# Patient Record
Sex: Male | Born: 1950
Health system: Southern US, Community
[De-identification: ages and names within clinical notes are randomized; demographics above are authoritative.]

## PROBLEM LIST (undated history)

## (undated) DIAGNOSIS — K635 Polyp of colon: Secondary | ICD-10-CM

## (undated) DIAGNOSIS — I10 Essential (primary) hypertension: Secondary | ICD-10-CM

## (undated) DIAGNOSIS — R011 Cardiac murmur, unspecified: Secondary | ICD-10-CM

## (undated) DIAGNOSIS — E669 Obesity, unspecified: Secondary | ICD-10-CM

## (undated) DIAGNOSIS — E785 Hyperlipidemia, unspecified: Secondary | ICD-10-CM

## (undated) HISTORY — DX: Hyperlipidemia, unspecified: E78.5

## (undated) HISTORY — PX: COLONOSCOPY: SHX174

## (undated) HISTORY — DX: Obesity, unspecified: E66.9

## (undated) HISTORY — DX: Cardiac murmur, unspecified: R01.1

## (undated) HISTORY — PX: WISDOM TOOTH EXTRACTION: SHX21

## (undated) HISTORY — DX: Polyp of colon: K63.5

## (undated) HISTORY — DX: Essential (primary) hypertension: I10

---

## 1993-06-30 HISTORY — PX: ORIF FINGER / THUMB FRACTURE: SUR932

## 2000-06-03 ENCOUNTER — Encounter: Payer: Self-pay | Admitting: Emergency Medicine

## 2000-06-03 ENCOUNTER — Emergency Department (HOSPITAL_COMMUNITY): Admission: EM | Admit: 2000-06-03 | Discharge: 2000-06-03 | Payer: Self-pay | Admitting: Emergency Medicine

## 2010-07-24 ENCOUNTER — Ambulatory Visit: Admit: 2010-07-24 | Payer: Self-pay | Admitting: Cardiology

## 2010-08-22 LAB — HM COLONOSCOPY

## 2010-08-23 ENCOUNTER — Encounter: Payer: Self-pay | Admitting: Cardiology

## 2010-08-23 ENCOUNTER — Institutional Professional Consult (permissible substitution) (INDEPENDENT_AMBULATORY_CARE_PROVIDER_SITE_OTHER): Payer: BC Managed Care – PPO | Admitting: Cardiology

## 2010-08-23 DIAGNOSIS — E782 Mixed hyperlipidemia: Secondary | ICD-10-CM | POA: Insufficient documentation

## 2010-08-23 DIAGNOSIS — E7800 Pure hypercholesterolemia, unspecified: Secondary | ICD-10-CM

## 2010-08-23 DIAGNOSIS — R011 Cardiac murmur, unspecified: Secondary | ICD-10-CM | POA: Insufficient documentation

## 2010-08-23 DIAGNOSIS — I10 Essential (primary) hypertension: Secondary | ICD-10-CM

## 2010-08-23 DIAGNOSIS — E785 Hyperlipidemia, unspecified: Secondary | ICD-10-CM | POA: Insufficient documentation

## 2010-08-27 NOTE — Assessment & Plan Note (Signed)
Summary: np6/heart murmur/bcbs/per donna 299000/notes on files/rs per ...   Referring Provider:  Dr. Patrina Levering Primary Provider:  Dr. Perrin Maltese  CC:  new patient.  Heart murmur.  Pt was prescribed a low dose bp medication by Dr. Perrin Maltese but has not started taking.  Pt waiting until cardiac consult.  He has had some chest pain in the past and a negative stress test. .  History of Present Illness: 60 yo with history of HTN and hyperlipidemia presents for cardiology evaluation.  Patient was noted on recent exam by his PCP to have a heart murmur.  Patient has good exercise tolerance.  He goes to the [facility] and works up for about 45 minutes at a time.  He can climb steps with no trouble.  No exertional dyspnea or chest pain.  He has rare episodes of very atypical chest pain.  He had a stress test in 2001 that he says he was told was normal.  BP has been running high, SBP 160s with Dr. Perrin Maltese and BP 140/76 today.  LDL was elevated when checked recently.    ECG: NSR, poor anterior R wave progression  Labs (2/12): HCT 39, K 4.2, creatinine 0.81, LDL 146, HDL 52, TSH normal  Current Medications (verified): 1)  None  Allergies (verified): No Known Drug Allergies  Past History:  Past Medical History: 1. Hyperlipidemia 2. Hypertension 3. Obesity 4. Colon polyp 5. Atypical chest pain: Stress test in 2001 was normal per patient's report.  6. Murmur  Family History: Father with CHF in 30s.  Siblings with no known coronary disease.  Mother died with cancer.   Social History: Quit smoking 1995.  Lives in [place], married, works in Air[employer].    Review of Systems       All systems reviewed and negative except as per HPI.   Vital Signs:  Patient profile:   60 year old male Height:      74 inches Weight:      281 pounds BMI:     36.21 Pulse rate:   67 / minute Pulse rhythm:   regular BP sitting:   140 / 76  (left arm) Cuff size:   large  Vitals Entered By: Judithe Modest CMA (August 23, 2010  9:12 AM)  Physical Exam  General:  Well developed, well nourished, in no acute distress. Overweight.  Head:  normocephalic and atraumatic Nose:  no deformity, discharge, inflammation, or lesions Mouth:  Teeth, gums and palate normal. Oral mucosa normal. Neck:  Neck supple, no JVD. No masses, thyromegaly or abnormal cervical nodes. Lungs:  Clear bilaterally to auscultation and percussion. Heart:  Non-displaced PMI, chest non-tender; regular rate and rhythm, S1, S2 without rubs or gallops. 2/6 early systolic murmur at RUSB.  Carotid upstroke normal, no bruit.  Pedals normal pulses. No edema, no varicosities. Abdomen:  Bowel sounds positive; abdomen soft and non-tender without masses, organomegaly, or hernias noted. No hepatosplenomegaly. Extremities:  No clubbing or cyanosis. Neurologic:  Alert and oriented x 3. Skin:  Intact without lesions or rashes. Psych:  Normal affect.   Impression & Recommendations:  Problem # 1:  CARDIAC MURMUR (ICD-785.2) Early systolic crescendo-decrescendo murmur at right upper sternal border.  This sounds like an aortic-type murmur, suspect aortic sclerosis or mild aortic stenosis.  Will get echocardiogram to assess.   Problem # 2:  HYPERTENSION, UNSPECIFIED (ICD-401.9) Elevated blood pressure.  Will start lisinopril 5 mg daily.  I will have him followup in 1 month and will get a BMET  at that time.   Problem # 3:  HYPERLIPIDEMIA-MIXED (ICD-272.4) LDL is elevated.  He will start simvastatin 20 mg daily with lipids/LFTs in 2 months.   Problem # 4:  CORONARY RISK Patient has a number of CAD risk factors but has good exercise tolerance with no exertional symptoms.  No definite indication for a stress test at this point.  Will have him start ASA 81 mg daily.   Other Orders: Echocardiogram (Echo)  Patient Instructions: 1)  Your physician recommends that you schedule a follow-up appointment in: f/u 1 month 2)  Your physician recommends that you return for lab  work in: BMET before next o.v. 3)  Your physician has recommended you make the following change in your medication: start 81mg  ASA IN 2 WEEKS, start simvastatin 20mg  every evening,start lisinopril 5mg  1 tab everyday 4)  Your physician has requested that you have an echocardiogram.  Echocardiography is a painless test that uses sound waves to create images of your heart. It provides your doctor with information about the size and shape of your heart and how well your heart's chambers and valves are working.  This procedure takes approximately one hour. There are no restrictions for this procedure. Prescriptions: PRINIVIL 5 MG TABS (LISINOPRIL) take 1 tab daily  #90 x 3   Entered by:   Ledon Snare, RN   Authorized by:   Marca Ancona, MD   Signed by:   Ledon Snare, RN on 08/23/2010   Method used:   Electronically to        [id number] (retail)       [address]       [place], [place]  [address]       Ph: [phone] or [phone]       Fax: [phone]   RxID:   [id number] SIMVASTATIN 20 MG TABS (SIMVASTATIN) take 1 tab every evening  #90 x 3   Entered by:   Ledon Snare, RN   Authorized by:   Marca Ancona, MD   Signed by:   Ledon Snare, RN on 08/23/2010   Method used:   Electronically to        [facility] (retail)       [address]       [place], [place]  [address]       Ph: [phone] or [phone]       Fax: [phone]   RxID:   [id number]

## 2010-09-02 ENCOUNTER — Other Ambulatory Visit (HOSPITAL_COMMUNITY): Payer: BC Managed Care – PPO

## 2010-09-02 ENCOUNTER — Other Ambulatory Visit: Payer: BC Managed Care – PPO

## 2010-09-09 ENCOUNTER — Encounter: Payer: Self-pay | Admitting: Cardiology

## 2010-09-24 ENCOUNTER — Ambulatory Visit: Payer: BC Managed Care – PPO | Admitting: Cardiology

## 2010-10-15 ENCOUNTER — Encounter: Payer: Self-pay | Admitting: Cardiology

## 2013-04-14 DIAGNOSIS — Z0271 Encounter for disability determination: Secondary | ICD-10-CM

## 2013-04-15 ENCOUNTER — Ambulatory Visit (INDEPENDENT_AMBULATORY_CARE_PROVIDER_SITE_OTHER): Payer: BC Managed Care – HMO | Admitting: Family Medicine

## 2013-04-15 VITALS — BP 120/80 | HR 62 | Temp 98.2°F | Resp 16 | Ht 74.5 in | Wt 247.0 lb

## 2013-04-15 DIAGNOSIS — E7800 Pure hypercholesterolemia, unspecified: Secondary | ICD-10-CM

## 2013-04-15 DIAGNOSIS — Z0000 Encounter for general adult medical examination without abnormal findings: Secondary | ICD-10-CM

## 2013-04-15 DIAGNOSIS — Z23 Encounter for immunization: Secondary | ICD-10-CM

## 2013-04-15 LAB — LIPID PANEL
Cholesterol: 191 mg/dL (ref 0–200)
HDL: 48 mg/dL (ref >39)
LDL Cholesterol: 121 mg/dL — ABNORMAL HIGH (ref 0–99)
Total CHOL/HDL Ratio: 4 Ratio
Triglycerides: 109 mg/dL (ref <150)
VLDL: 22 mg/dL (ref 0–40)

## 2013-04-15 LAB — COMPREHENSIVE METABOLIC PANEL
ALT: 14 U/L (ref 0–53)
AST: 16 U/L (ref 0–37)
Albumin: 4.5 g/dL (ref 3.5–5.2)
Alkaline Phosphatase: 26 U/L — ABNORMAL LOW (ref 39–117)
BUN: 14 mg/dL (ref 6–23)
CO2: 29 mEq/L (ref 19–32)
Calcium: 9.5 mg/dL (ref 8.4–10.5)
Chloride: 103 mEq/L (ref 96–112)
Creat: 0.78 mg/dL (ref 0.50–1.35)
Glucose, Bld: 108 mg/dL — ABNORMAL HIGH (ref 70–99)
Potassium: 4.7 mEq/L (ref 3.5–5.3)
Sodium: 139 mEq/L (ref 135–145)
Total Bilirubin: 0.6 mg/dL (ref 0.3–1.2)
Total Protein: 7.1 g/dL (ref 6.0–8.3)

## 2013-04-15 LAB — POCT CBC
Granulocyte percent: 62.2 %G (ref 37–80)
HCT, POC: 45.2 % (ref 43.5–53.7)
Hemoglobin: 14.5 g/dL (ref 14.1–18.1)
Lymph, poc: 2.2 (ref 0.6–3.4)
MCH, POC: 31 pg (ref 27–31.2)
MCHC: 32.1 g/dL (ref 31.8–35.4)
MCV: 96.6 fL (ref 80–97)
MID (cbc): 0.4 (ref 0–0.9)
MPV: 8.6 fL (ref 0–99.8)
POC Granulocyte: 4.3 (ref 2–6.9)
POC LYMPH PERCENT: 31.5 %L (ref 10–50)
POC MID %: 6.3 %M (ref 0–12)
Platelet Count, POC: 291 10*3/uL (ref 142–424)
RBC: 4.68 M/uL — AB (ref 4.69–6.13)
RDW, POC: 13.5 %
WBC: 6.9 10*3/uL (ref 4.6–10.2)

## 2013-04-15 LAB — PSA: PSA: 2.91 ng/mL (ref <=4.00)

## 2013-04-15 NOTE — Progress Notes (Addendum)
[facility] [address] [place] [address] [phone]  Date:  04/15/2013   Name:  Dale Mitchell   DOB:  February 16, 1951   MRN:  [id number]  PCP:  No primary provider on file.    Chief Complaint: Annual Exam and Labs Only   History of Present Illness:  Dale Mitchell is a 62 y.o. very pleasant male patient who presents with the following:  Here today for a CPE. He has seen cardiology in the past, but has since lost weight and his BP is improved. He does exercise regularly also.  He did have a cardiac evaluation and was told that all was ok.  It seems that he had a panic attack due to flying after 9/11 which triggered an episode of non- cardiac CP.  He is fasting today History of anemia  Last colonoscopy was in 2012.  All looked ok.   He would like to get a flu shot and zostavax rx Last Td in 2003 per paper chart.    Married, 2 adult children who live in the area  Patient Active Problem List   Diagnosis Date Noted  . HYPERLIPIDEMIA-MIXED 08/23/2010  . HYPERTENSION, UNSPECIFIED 08/23/2010  . CARDIAC MURMUR 08/23/2010    Past Medical History  Diagnosis Date  . HLD (hyperlipidemia)   . HTN (hypertension)   . Obesity   . Colon polyp   . Chest pain   . Murmur     History reviewed. No pertinent past surgical history.  History  Substance Use Topics  . Smoking status: Former Smoker    Quit date: 06/30/1993  . Smokeless tobacco: Not on file  . Alcohol Use: Not on file    Family History  Problem Relation Age of Onset  . Heart failure Father   . Emphysema Father   . Cancer Mother     No Known Allergies  Medication list has been reviewed and updated.  Current Outpatient Prescriptions on File Prior to Visit  Medication Sig Dispense Refill  . lisinopril (PRINIVIL,ZESTRIL) 5 MG tablet Take 5 mg by mouth daily.        . simvastatin (ZOCOR) 20 MG tablet Take 20 mg by mouth at bedtime.         No current facility-administered medications on file prior  to visit.    Review of Systems:  As per HPI- otherwise negative. He has lisinopril on his list but has not taken it in some time actually  He is also off the zocor for some time  Physical Examination: Filed Vitals:   04/15/13 0837  BP: 120/80  Pulse: 62  Temp: 98.2 F (36.8 C)  Resp: 16   Filed Vitals:   04/15/13 0837  Height: 6' 2.5" (1.892 m)  Weight: 247 lb (112.038 kg)   Body mass index is 31.3 kg/(m^2). Ideal Body Weight: Weight in (lb) to have BMI = 25: 196.9  GEN: WDWN, NAD, Non-toxic, A & O x 3   HEENT: Atraumatic, Normocephalic. Neck supple. No masses, No LAD.  Bilateral TM wnl, oropharynx normal.  PEERL,EOMI.   Ears and Nose: No external deformity. CV: RRR, No M/G/R. No JVD. No thrill. No extra heart sounds. PULM: CTA B, no wheezes, crackles, rhonchi. No retractions. No resp. distress. No accessory muscle use. ABD: S, NT, ND. No rebound. No HSM. EXTR: No c/c/e NEURO Normal gait.  PSYCH: Normally interactive. Conversant. Not depressed or anxious appearing.  Calm demeanor.  GN:FAOZHY exam, no testicular masses Rectal: normal,  no prostate enlargement or palpable nodules  Results for orders placed in visit on 04/15/13  POCT CBC      Result Value Range   WBC 6.9  4.6 - 10.2 K/uL   Lymph, poc 2.2  0.6 - 3.4   POC LYMPH PERCENT 31.5  10 - 50 %L   MID (cbc) 0.4  0 - 0.9   POC MID % 6.3  0 - 12 %M   POC Granulocyte 4.3  2 - 6.9   Granulocyte percent 62.2  37 - 80 %G   RBC 4.68 (*) 4.69 - 6.13 M/uL   Hemoglobin 14.5  14.1 - 18.1 g/dL   HCT, POC 16.1  09.6 - 53.7 %   MCV 96.6  80 - 97 fL   MCH, POC 31.0  27 - 31.2 pg   MCHC 32.1  31.8 - 35.4 g/dL   RDW, POC 04.5     Platelet Count, POC 291  142 - 424 K/uL   MPV 8.6  0 - 99.8 fL     Assessment and Plan: Physical exam - Plan: POCT CBC, Comprehensive metabolic panel, PSA, Flu Vaccine QUAD 36+ mos IM, Tdap vaccine greater than or equal to 7yo IM  High cholesterol - Plan: Lipid panel  history of high CHL-  currently off his zocor but has lost weight and is exercising.   Will see if he still needs zocor or not pending his lipid panel.  No longer needs medication for HTN Will plan further follow- up pending labs. Catch up on immunizations today as well  Signed Abbe Amsterdam, MD

## 2013-04-15 NOTE — Patient Instructions (Addendum)
Keep up the great work with diet and exercise!  Get your shingles shot at your convenience please have the drug store send up confirmation so we can record the shot in your chart

## 2013-04-17 ENCOUNTER — Encounter: Payer: Self-pay | Admitting: Family Medicine

## 2014-08-11 ENCOUNTER — Ambulatory Visit (INDEPENDENT_AMBULATORY_CARE_PROVIDER_SITE_OTHER): Payer: 59 | Admitting: Family Medicine

## 2014-08-11 ENCOUNTER — Ambulatory Visit (INDEPENDENT_AMBULATORY_CARE_PROVIDER_SITE_OTHER): Payer: 59

## 2014-08-11 VITALS — BP 138/72 | HR 84 | Temp 98.2°F | Resp 18 | Ht 74.0 in | Wt 225.0 lb

## 2014-08-11 DIAGNOSIS — S8991XA Unspecified injury of right lower leg, initial encounter: Secondary | ICD-10-CM

## 2014-08-11 DIAGNOSIS — M25561 Pain in right knee: Secondary | ICD-10-CM

## 2014-08-11 NOTE — Patient Instructions (Signed)
Your x-rays were read as negative for any fracture.  I spoke with one of the doctors at [facility] ortho and they can see you in clinic tomorrow.  [place] [facility]   [facility]  Address: [address] [place], [place] [address]  [phone]

## 2014-08-11 NOTE — Progress Notes (Addendum)
[facility] [address], [place] [address] [phone]  Date:  08/11/2014   Name:  Dale Mitchell   DOB:  04-21-1951   MRN:  [id number]  PCP:  Lamar Blinks, MD    Chief Complaint: Knee Injury   History of Present Illness:  Dale Mitchell is a 64 y.o. very pleasant male patient who presents with the following:  Generally healthy man here today with a right knee injury.  Today is Friday- on Monday he slipped, fell and landed right on his right kneecap. He may have hit his knee on the edge of a stone step.  He was not able to bear weight at all- was seen at the ER at [facility].  He had x-rays that were negative per his report.  They put him in a knee immobilizer and gave him crutches.  He states that he is now able to bend the knee just a bit and he is able to maneuver on crutches. He is not using anything for pain.   He does not knee pain at rest, but he does have pain with flexion. Also notes that although he can flex his hip he is not able to extend his leg at the knee.   He is elevating and icing his knee a lot.   He has not been able to get back to work yet due to this injruy.  The knee feels unstable.  It did fall out from under him when he first hurt his knee and tried to stand on it.  He is not established with any particular orthopedic office in town  Patient Active Problem List   Diagnosis Date Noted  . HYPERLIPIDEMIA-MIXED 08/23/2010  . HYPERTENSION, UNSPECIFIED 08/23/2010  . CARDIAC MURMUR 08/23/2010    Past Medical History  Diagnosis Date  . HLD (hyperlipidemia)   . HTN (hypertension)   . Obesity   . Colon polyp   . Chest pain   . Murmur     History reviewed. No pertinent past surgical history.  History  Substance Use Topics  . Smoking status: Former Smoker    Quit date: 06/30/1993  . Smokeless tobacco: Not on file  . Alcohol Use: Not on file    Family History  Problem Relation Age of Onset  . Heart failure Father   .  Emphysema Father   . Cancer Mother     No Known Allergies  Medication list has been reviewed and updated.  No current outpatient prescriptions on file prior to visit.   No current facility-administered medications on file prior to visit.    Review of Systems: As per HPI- otherwise negative.    Physical Examination: Filed Vitals:   08/11/14 1403  BP: 138/72  Pulse: 110  Temp: 98.2 F (36.8 C)  Resp: 18   Filed Vitals:   08/11/14 1403  Height: 6\' 2"  (1.88 m)  Weight: 225 lb (102.059 kg)   Body mass index is 28.88 kg/(m^2). Ideal Body Weight: Weight in (lb) to have BMI = 25: 194.3  GEN: WDWN, NAD, Non-toxic, A & O x 3, looks well, tall build HEENT: Atraumatic, Normocephalic. Neck supple. No masses, No LAD. Ears and Nose: No external deformity. CV: RRR, No M/G/R. No JVD. No thrill. No extra heart sounds. PULM: CTA B, no wheezes, crackles, rhonchi. No retractions. No resp. distress. No accessory muscle use. EXTR: No c/c/e NEURO Not able to bear weight on right leg.  Using crutches and in a long  leg soft immobilizer PSYCH: Normally interactive. Conversant. Not depressed or anxious appearing.  Calm demeanor.  Right knee: large effusion, tenderness over medial tibial plateau.  Ligaments seem stable except he is not able to do a straight leg raise.  I appreciate a defect where the inferior patellar tendon should be.  Swelling does limit exam  [facility] reading (PRIMARY) by  Dr. Lorelei Pont. Right knee: no fracture noted, large effusion.  High riding patella- possible patellar tendon rupture  RIGHT KNEE - COMPLETE 4+ VIEW  COMPARISON: None.  FINDINGS: Knee joint effusion is noted. No acute bony abnormality. No evidence of fracture or dislocation.  IMPRESSION: Prominent knee joint effusion. No acute bony abnormality identified.  Assessment and Plan: Pain in right knee - Plan: DG Knee Complete 4 Views Right, Ambulatory referral to Orthopedic Surgery  Injury of right knee,  initial encounter - Plan: DG Knee Complete 4 Views Right, Ambulatory referral to Orthopedic Surgery  Right knee injury, possible quadriceps or patellar tendon rupture. Continue knee immobilizer and crutches.  Spoke with Dr. Berenice Primas at [facility] ortho who kindly agreed to see pt tomorrow in Saturday clinic.  Will place referral right away.   Signed Lamar Blinks, MD

## 2014-08-21 ENCOUNTER — Other Ambulatory Visit: Payer: Self-pay | Admitting: Orthopedic Surgery

## 2014-08-22 ENCOUNTER — Encounter (HOSPITAL_BASED_OUTPATIENT_CLINIC_OR_DEPARTMENT_OTHER): Payer: Self-pay | Admitting: *Deleted

## 2014-08-22 NOTE — Progress Notes (Signed)
No labs needed

## 2014-08-23 ENCOUNTER — Ambulatory Visit (HOSPITAL_BASED_OUTPATIENT_CLINIC_OR_DEPARTMENT_OTHER): Payer: 59 | Admitting: Anesthesiology

## 2014-08-23 ENCOUNTER — Ambulatory Visit (HOSPITAL_BASED_OUTPATIENT_CLINIC_OR_DEPARTMENT_OTHER)
Admission: RE | Admit: 2014-08-23 | Discharge: 2014-08-23 | Disposition: A | Discharge status: Home / Self Care | Payer: 59 | Source: Ambulatory Visit | Attending: Orthopedic Surgery | Admitting: Orthopedic Surgery

## 2014-08-23 ENCOUNTER — Encounter (HOSPITAL_COMMUNITY): Admission: RE | Payer: Self-pay | Source: Ambulatory Visit

## 2014-08-23 ENCOUNTER — Ambulatory Visit (HOSPITAL_COMMUNITY): Admission: RE | Admit: 2014-08-23 | Payer: 59 | Source: Ambulatory Visit | Admitting: Orthopedic Surgery

## 2014-08-23 ENCOUNTER — Encounter (HOSPITAL_BASED_OUTPATIENT_CLINIC_OR_DEPARTMENT_OTHER)
Admission: RE | Disposition: A | Discharge status: Home / Self Care | Payer: Self-pay | Source: Ambulatory Visit | Attending: Orthopedic Surgery

## 2014-08-23 ENCOUNTER — Encounter (HOSPITAL_BASED_OUTPATIENT_CLINIC_OR_DEPARTMENT_OTHER): Payer: Self-pay | Admitting: Anesthesiology

## 2014-08-23 DIAGNOSIS — E785 Hyperlipidemia, unspecified: Secondary | ICD-10-CM | POA: Insufficient documentation

## 2014-08-23 DIAGNOSIS — X58XXXA Exposure to other specified factors, initial encounter: Secondary | ICD-10-CM | POA: Diagnosis not present

## 2014-08-23 DIAGNOSIS — I10 Essential (primary) hypertension: Secondary | ICD-10-CM | POA: Diagnosis not present

## 2014-08-23 DIAGNOSIS — E669 Obesity, unspecified: Secondary | ICD-10-CM | POA: Diagnosis not present

## 2014-08-23 DIAGNOSIS — Z87891 Personal history of nicotine dependence: Secondary | ICD-10-CM | POA: Diagnosis not present

## 2014-08-23 DIAGNOSIS — Y929 Unspecified place or not applicable: Secondary | ICD-10-CM | POA: Diagnosis not present

## 2014-08-23 DIAGNOSIS — S76111A Strain of right quadriceps muscle, fascia and tendon, initial encounter: Secondary | ICD-10-CM | POA: Insufficient documentation

## 2014-08-23 DIAGNOSIS — S96811A Strain of other specified muscles and tendons at ankle and foot level, right foot, initial encounter: Secondary | ICD-10-CM | POA: Insufficient documentation

## 2014-08-23 DIAGNOSIS — Z6830 Body mass index (BMI) 30.0-30.9, adult: Secondary | ICD-10-CM | POA: Diagnosis not present

## 2014-08-23 HISTORY — PX: QUADRICEPS TENDON REPAIR: SHX756

## 2014-08-23 SURGERY — REPAIR, TENDON, QUADRICEPS
Anesthesia: General | Laterality: Right

## 2014-08-23 MED ORDER — BUPIVACAINE-EPINEPHRINE (PF) 0.5% -1:200000 IJ SOLN
INTRAMUSCULAR | Status: DC | PRN
  Administered 2014-08-23: 30 mL via PERINEURAL

## 2014-08-23 MED ORDER — PROMETHAZINE HCL 25 MG/ML IJ SOLN: 6.2500 mg | INTRAMUSCULAR | Status: DC | PRN

## 2014-08-23 MED ORDER — CLINDAMYCIN PHOSPHATE 900 MG/50ML IV SOLN: 900.0000 mg | INTRAVENOUS | Status: DC

## 2014-08-23 MED ORDER — OXYCODONE HCL 5 MG/5ML PO SOLN: 5.0000 mg | Freq: Once | ORAL | Status: DC | PRN

## 2014-08-23 MED ORDER — CEFAZOLIN SODIUM-DEXTROSE 2-3 GM-% IV SOLR
2.0000 g | INTRAVENOUS | Status: AC
  Administered 2014-08-23: 2 g via INTRAVENOUS

## 2014-08-23 MED ORDER — MIDAZOLAM HCL 2 MG/2ML IJ SOLN
INTRAMUSCULAR | Status: AC
  Filled 2014-08-23: qty 2

## 2014-08-23 MED ORDER — FENTANYL CITRATE 0.05 MG/ML IJ SOLN
50.0000 ug | INTRAMUSCULAR | Status: DC | PRN
  Administered 2014-08-23: 100 ug via INTRAVENOUS

## 2014-08-23 MED ORDER — MIDAZOLAM HCL 2 MG/2ML IJ SOLN
1.0000 mg | INTRAMUSCULAR | Status: DC | PRN
  Administered 2014-08-23: 2 mg via INTRAVENOUS

## 2014-08-23 MED ORDER — PROPOFOL 10 MG/ML IV BOLUS
INTRAVENOUS | Status: DC | PRN
  Administered 2014-08-23: 200 mg via INTRAVENOUS

## 2014-08-23 MED ORDER — HYDROMORPHONE HCL 1 MG/ML IJ SOLN: 0.2500 mg | INTRAMUSCULAR | Status: DC | PRN

## 2014-08-23 MED ORDER — FENTANYL CITRATE 0.05 MG/ML IJ SOLN
INTRAMUSCULAR | Status: AC
  Filled 2014-08-23: qty 2

## 2014-08-23 MED ORDER — CHLORHEXIDINE GLUCONATE 4 % EX LIQD: 60.0000 mL | Freq: Once | CUTANEOUS | Status: DC

## 2014-08-23 MED ORDER — FENTANYL CITRATE 0.05 MG/ML IJ SOLN
INTRAMUSCULAR | Status: DC | PRN
  Administered 2014-08-23: 100 ug via INTRAVENOUS

## 2014-08-23 MED ORDER — ONDANSETRON HCL 4 MG/2ML IJ SOLN
INTRAMUSCULAR | Status: DC | PRN
  Administered 2014-08-23: 4 mg via INTRAVENOUS

## 2014-08-23 MED ORDER — CEFAZOLIN SODIUM 1-5 GM-% IV SOLN
1.0000 g | Freq: Once | INTRAVENOUS | Status: AC
  Administered 2014-08-23: 1 g via INTRAVENOUS

## 2014-08-23 MED ORDER — DEXAMETHASONE SODIUM PHOSPHATE 10 MG/ML IJ SOLN
INTRAMUSCULAR | Status: DC | PRN
  Administered 2014-08-23: 10 mg via INTRAVENOUS

## 2014-08-23 MED ORDER — OXYCODONE HCL 5 MG PO TABS: 5.0000 mg | ORAL_TABLET | Freq: Once | ORAL | Status: DC | PRN

## 2014-08-23 MED ORDER — LACTATED RINGERS IV SOLN
INTRAVENOUS | Status: DC
Start: 2014-08-23 — End: 2014-08-23
  Administered 2014-08-23: 11:00:00 via INTRAVENOUS

## 2014-08-23 MED ORDER — CEFAZOLIN SODIUM 1-5 GM-% IV SOLN
INTRAVENOUS | Status: AC
  Filled 2014-08-23: qty 50

## 2014-08-23 SURGICAL SUPPLY — 65 items
APL SKNCLS STERI-STRIP NONHPOA (GAUZE/BANDAGES/DRESSINGS) ×1
BANDAGE ELASTIC 6 VELCRO ST LF (GAUZE/BANDAGES/DRESSINGS) ×3 IMPLANT
BANDAGE ESMARK 6X9 LF (GAUZE/BANDAGES/DRESSINGS) ×1 IMPLANT
BENZOIN TINCTURE PRP APPL 2/3 (GAUZE/BANDAGES/DRESSINGS) ×2 IMPLANT
BLADE SURG 15 STRL LF DISP TIS (BLADE) ×1 IMPLANT
BLADE SURG 15 STRL SS (BLADE) ×3
BNDG CMPR 9X6 STRL LF SNTH (GAUZE/BANDAGES/DRESSINGS) ×1
BNDG COHESIVE 4X5 TAN STRL (GAUZE/BANDAGES/DRESSINGS) ×2 IMPLANT
BNDG ESMARK 6X9 LF (GAUZE/BANDAGES/DRESSINGS) ×3
CANISTER SUCT 1200ML W/VALVE (MISCELLANEOUS) ×3 IMPLANT
CLOSURE STERI-STRIP 1/2X4 (GAUZE/BANDAGES/DRESSINGS) ×1
CLSR STERI-STRIP ANTIMIC 1/2X4 (GAUZE/BANDAGES/DRESSINGS) ×1 IMPLANT
CUFF TOURNIQUET SINGLE 34IN LL (TOURNIQUET CUFF) ×3 IMPLANT
DECANTER SPIKE VIAL GLASS SM (MISCELLANEOUS) IMPLANT
DRAPE EXTREMITY T 121X128X90 (DRAPE) ×3 IMPLANT
DRAPE U-SHAPE 47X51 STRL (DRAPES) ×3 IMPLANT
DRSG PAD ABDOMINAL 8X10 ST (GAUZE/BANDAGES/DRESSINGS) ×2 IMPLANT
DURAPREP 26ML APPLICATOR (WOUND CARE) ×3 IMPLANT
ELECT REM PT RETURN 9FT ADLT (ELECTROSURGICAL) ×3
ELECTRODE REM PT RTRN 9FT ADLT (ELECTROSURGICAL) ×1 IMPLANT
GAUZE SPONGE 4X4 12PLY STRL (GAUZE/BANDAGES/DRESSINGS) ×3 IMPLANT
GAUZE XEROFORM 1X8 LF (GAUZE/BANDAGES/DRESSINGS) ×3 IMPLANT
GLOVE BIOGEL M STRL SZ7.5 (GLOVE) ×2 IMPLANT
GLOVE BIOGEL PI IND STRL 8 (GLOVE) ×2 IMPLANT
GLOVE BIOGEL PI INDICATOR 8 (GLOVE) ×6
GLOVE ECLIPSE 7.5 STRL STRAW (GLOVE) ×6 IMPLANT
GOWN STRL REUS W/TWL XL LVL3 (GOWN DISPOSABLE) ×7 IMPLANT
IMMOBILIZER KNEE 24 THIGH 36 (MISCELLANEOUS) IMPLANT
IMMOBILIZER KNEE 24 UNIV (MISCELLANEOUS)
KNEE WRAP E Z 3 GEL PACK (MISCELLANEOUS) ×1 IMPLANT
NDL MAYO TROCAR (NEEDLE) IMPLANT
NDL SUT 6 .5 CRC .975X.05 MAYO (NEEDLE) IMPLANT
NEEDLE MAYO TAPER (NEEDLE) ×3
NEEDLE MAYO TROCAR (NEEDLE) IMPLANT
NS IRRIG 1000ML POUR BTL (IV SOLUTION) ×3 IMPLANT
PACK ARTHROSCOPY DSU (CUSTOM PROCEDURE TRAY) ×1 IMPLANT
PACK BASIN DAY SURGERY FS (CUSTOM PROCEDURE TRAY) ×3 IMPLANT
PADDING CAST ABS 4INX4YD NS (CAST SUPPLIES) ×2
PADDING CAST ABS 6INX4YD NS (CAST SUPPLIES) ×4
PADDING CAST ABS COTTON 4X4 ST (CAST SUPPLIES) ×1 IMPLANT
PADDING CAST ABS COTTON 6X4 NS (CAST SUPPLIES) ×2 IMPLANT
PADDING CAST COTTON 6X4 STRL (CAST SUPPLIES) ×3 IMPLANT
PASSER SUT SWANSON 36MM LOOP (INSTRUMENTS) ×2 IMPLANT
PENCIL BUTTON HOLSTER BLD 10FT (ELECTRODE) ×3 IMPLANT
SLEEVE SCD COMPRESS KNEE MED (MISCELLANEOUS) ×2 IMPLANT
SPONGE LAP 4X18 X RAY DECT (DISPOSABLE) ×3 IMPLANT
STAPLER VISISTAT 35W (STAPLE) IMPLANT
STOCKINETTE IMPERVIOUS LG (DRAPES) ×2 IMPLANT
SUT 2 FIBERLOOP 20 STRT BLUE (SUTURE)
SUT FIBERWIRE #2 38 T-5 BLUE (SUTURE) ×6
SUT FIBERWIRE #5 38 CONV NDL (SUTURE) ×3
SUT MNCRL AB 3-0 PS2 18 (SUTURE) ×2 IMPLANT
SUT VIC AB 0 CT1 27 (SUTURE) ×3
SUT VIC AB 0 CT1 27XBRD ANBCTR (SUTURE) ×1 IMPLANT
SUT VIC AB 1 CT1 27 (SUTURE) ×3
SUT VIC AB 1 CT1 27XBRD ANBCTR (SUTURE) ×1 IMPLANT
SUT VIC AB 2-0 CT1 27 (SUTURE) ×3
SUT VIC AB 2-0 CT1 TAPERPNT 27 (SUTURE) ×1 IMPLANT
SUTURE 2 FIBERLOOP 20 STRT BLU (SUTURE) ×1 IMPLANT
SUTURE FIBERWR #2 38 T-5 BLUE (SUTURE) IMPLANT
SUTURE FIBERWR #5 38 CONV NDL (SUTURE) ×2 IMPLANT
SYR BULB 3OZ (MISCELLANEOUS) ×3 IMPLANT
TOWEL OR 17X24 6PK STRL BLUE (TOWEL DISPOSABLE) ×3 IMPLANT
UNDERPAD 30X30 INCONTINENT (UNDERPADS AND DIAPERS) ×3 IMPLANT
YANKAUER SUCT BULB TIP NO VENT (SUCTIONS) ×3 IMPLANT

## 2014-08-23 NOTE — Anesthesia Procedure Notes (Addendum)
Anesthesia Regional Block:  Femoral nerve block  Pre-Anesthetic Checklist: ,, timeout performed, Correct Patient, Correct Site, Correct Laterality, Correct Procedure, Correct Position, site marked, Risks and benefits discussed,  Surgical consent,  Pre-op evaluation,  At surgeon's request and post-op pain management  Laterality: Right  Prep: chloraprep       Needles:  Injection technique: Single-shot  Needle Type: Echogenic Stimulator Needle     Needle Length: 5cm 5 cm Needle Gauge: 22 and 22 G    Additional Needles:  Procedures: ultrasound guided (picture in chart) and nerve stimulator Femoral nerve block  Nerve Stimulator or Paresthesia:  Response: quadraceps contraction, 0.45 mA,   Additional Responses:   Narrative:  Start time: 08/23/2014 11:28 AM End time: 08/23/2014 11:38 AM Injection made incrementally with aspirations every 5 mL.  Performed by: Personally  Anesthesiologist: Duane Boston  Additional Notes: Functioning IV was confirmed and monitors were applied.  A 1mm 22ga Arrow echogenic stimulator needle was used. Sterile prep and drape,hand hygiene and sterile gloves were used. Ultrasound guidance: relevant anatomy identified, needle position confirmed, local anesthetic spread visualized around nerve(s)., vascular puncture avoided.  Image printed for medical record. Negative aspiration and negative test dose prior to incremental administration of local anesthetic. The patient tolerated the procedure well.     Procedure Name: LMA Insertion Performed by: Tawni Millers Pre-anesthesia Checklist: Patient identified, Emergency Drugs available, Suction available and Patient being monitored Patient Re-evaluated:Patient Re-evaluated prior to inductionOxygen Delivery Method: Circle System Utilized Preoxygenation: Pre-oxygenation with 100% oxygen Intubation Type: IV induction Ventilation: Mask ventilation without difficulty LMA: LMA inserted LMA Size: 5.0 Number of  attempts: 1 Airway Equipment and Method: Bite block Placement Confirmation: positive ETCO2 Tube secured with: Tape Dental Injury: Teeth and Oropharynx as per pre-operative assessment

## 2014-08-23 NOTE — Anesthesia Preprocedure Evaluation (Addendum)
Anesthesia Evaluation  Patient identified by MRN, date of birth, ID band Patient awake    Reviewed: Allergy & Precautions, H&P , NPO status , Patient's Chart, lab work & pertinent test results  History of Anesthesia Complications Negative for: history of anesthetic complications  Airway Mallampati: II  TM Distance: >3 FB Neck ROM: Full    Dental  (+) Teeth Intact, Dental Advisory Given   Pulmonary former smoker,    Pulmonary exam normal       Cardiovascular     Neuro/Psych negative neurological ROS  negative psych ROS   GI/Hepatic negative GI ROS, Neg liver ROS,   Endo/Other  negative endocrine ROS  Renal/GU negative Renal ROS     Musculoskeletal   Abdominal   Peds  Hematology   Anesthesia Other Findings   Reproductive/Obstetrics negative OB ROS                            Anesthesia Physical Anesthesia Plan  ASA: II  Anesthesia Plan: General LMA   Post-op Pain Management: MAC Combined w/ Regional for Post-op pain   Induction:   Airway Management Planned: LMA  Additional Equipment:   Intra-op Plan:   Post-operative Plan: Extubation in OR  Informed Consent: I have reviewed the patients History and Physical, chart, labs and discussed the procedure including the risks, benefits and alternatives for the proposed anesthesia with the patient or authorized representative who has indicated his/her understanding and acceptance.   Dental advisory given  Plan Discussed with: CRNA, Anesthesiologist and Surgeon  Anesthesia Plan Comments:        Anesthesia Quick Evaluation

## 2014-08-23 NOTE — Transfer of Care (Signed)
Immediate Anesthesia Transfer of Care Note  Patient: Dale Mitchell  Procedure(s) Performed: Procedure(s): RIGHT QUADRICEP TENDON REPAIR (Right)  Patient Location: PACU  Anesthesia Type:GA combined with regional for post-op pain  Level of Consciousness: awake, alert  and patient cooperative  Airway & Oxygen Therapy: Patient Spontanous Breathing and Patient connected to face mask oxygen  Post-op Assessment: Report given to RN and Post -op Vital signs reviewed and stable  Post vital signs: Reviewed and stable  Last Vitals:  Filed Vitals:   08/23/14 1313  BP:   Pulse: 99  Temp:   Resp:     Complications: No apparent anesthesia complications

## 2014-08-23 NOTE — Progress Notes (Signed)
Assisted Dr. Singer with right, ultrasound guided, femoral block. Side rails up, monitors on throughout procedure. See vital signs in flow sheet. Tolerated Procedure well.  

## 2014-08-23 NOTE — Brief Op Note (Signed)
08/23/2014  4:42 PM  PATIENT:  Jacqlyn Krauss  64 y.o. male  PRE-OPERATIVE DIAGNOSIS:  RUPTURED QUADRICEP TENDON RIGHT  POST-OPERATIVE DIAGNOSIS:  RUPTURED QUADRICEP TENDON RIGHT  PROCEDURE:  Procedure(s): RIGHT QUADRICEP TENDON REPAIR (Right)  SURGEON:  Surgeon(s) and Role:    * Alta Corning, MD - Primary  PHYSICIAN ASSISTANT:   ASSISTANTS: bethune   ANESTHESIA:   general  EBL:  Total I/O In: 2240 [P.O.:240; I.V.:2000] Out: -   BLOOD ADMINISTERED:none  DRAINS: none   LOCAL MEDICATIONS USED:  MARCAINE     SPECIMEN:  No Specimen  DISPOSITION OF SPECIMEN:  N/A  COUNTS:  YES  TOURNIQUET:   Total Tourniquet Time Documented: Thigh (Right) - 59 minutes Total: Thigh (Right) - 59 minutes   DICTATION: .Other Dictation: Dictation Number [id number]  PLAN OF CARE: Discharge to home after PACU  PATIENT DISPOSITION:  PACU - hemodynamically stable.   Delay start of Pharmacological VTE agent (>24hrs) due to surgical blood loss or risk of bleeding: no

## 2014-08-23 NOTE — Discharge Instructions (Signed)
Keep dressing clean and dry. Keep dressing on until seen in office for follow-up. Knee immobilizer at all times. Weight bearing as tolerated in knee immobilizer.   Regional Anesthesia Blocks  1. Numbness or the inability to move the "blocked" extremity may last from 3-48 hours after placement. The length of time depends on the medication injected and your individual response to the medication. If the numbness is not going away after 48 hours, call your surgeon.  2. The extremity that is blocked will need to be protected until the numbness is gone and the  Strength has returned. Because you cannot feel it, you will need to take extra care to avoid injury. Because it may be weak, you may have difficulty moving it or using it. You may not know what position it is in without looking at it while the block is in effect.  3. For blocks in the legs and feet, returning to weight bearing and walking needs to be done carefully. You will need to wait until the numbness is entirely gone and the strength has returned. You should be able to move your leg and foot normally before you try and bear weight or walk. You will need someone to be with you when you first try to ensure you do not fall and possibly risk injury.  4. Bruising and tenderness at the needle site are common side effects and will resolve in a few days.  5. Persistent numbness or new problems with movement should be communicated to the surgeon or the [place] [phone] [place] [phone]).  Post Anesthesia Home Care Instructions  Activity: Get plenty of rest for the remainder of the day. A responsible adult should stay with you for 24 hours following the procedure.  For the next 24 hours, DO NOT: -Drive a car -Paediatric nurse -Drink alcoholic beverages -Take any medication unless instructed by your physician -Make any legal decisions or sign important papers.  Meals: Start with liquid foods  such as gelatin or soup. Progress to regular foods as tolerated. Avoid greasy, spicy, heavy foods. If nausea and/or vomiting occur, drink only clear liquids until the nausea and/or vomiting subsides. Call your physician if vomiting continues.  Special Instructions/Symptoms: Your throat may feel dry or sore from the anesthesia or the breathing tube placed in your throat during surgery. If this causes discomfort, gargle with warm salt water. The discomfort should disappear within 24 hours.

## 2014-08-23 NOTE — H&P (Signed)
PREOPERATIVE H&P  Chief Complaint: r quad rupture  HPI: Dale Mitchell is a 64 y.o. male who presents for evaluation of r quad rupture. It has been present for 10 days and has been worsening. He has failed conservative measures. Pain is rated as moderate.  Past Medical History  Diagnosis Date  . HLD (hyperlipidemia)   . Obesity   . Colon polyp   . HTN (hypertension)     no meds  . Murmur     no echo ever done   Past Surgical History  Procedure Laterality Date  . Colonoscopy    . Wisdom tooth extraction    . Orif finger / thumb fracture  1995    right   History   Social History  . Marital Status: Married    Spouse Name: N/A  . Number of Children: N/A  . Years of Education: N/A   Occupational History  . sales    Social History Main Topics  . Smoking status: Former Smoker    Quit date: 06/30/1993  . Smokeless tobacco: Not on file  . Alcohol Use: Yes     Comment: rare  . Drug Use: No  . Sexual Activity: Not on file   Other Topics Concern  . None   Social History Narrative   Family History  Problem Relation Age of Onset  . Heart failure Father   . Emphysema Father   . Cancer Mother    No Known Allergies Prior to Admission medications   Medication Sig Start Date End Date Taking? Authorizing Provider  oxyCODONE-acetaminophen (PERCOCET/ROXICET) 5-325 MG per tablet Take by mouth every 4 (four) hours as needed for severe pain.   Yes Historical Provider, MD     Positive ROS: none  All other systems have been reviewed and were otherwise negative with the exception of those mentioned in the HPI and as above.  Physical Exam: There were no vitals filed for this visit.  General: Alert, no acute distress Cardiovascular: No pedal edema Respiratory: No cyanosis, no use of accessory musculature GI: No organomegaly, abdomen is soft and non-tender Skin: No lesions in the area of chief complaint Neurologic: Sensation intact distally Psychiatric: Patient is competent  for consent with normal mood and affect Lymphatic: No axillary or cervical lymphadenopathy  MUSCULOSKELETAL: r leg: palp gap between quad tendon and patella  MRI: + for quad rupture  Assessment/Plan: RUPTURED QUADRICEP TENDON RIGHT Plan for Procedure(s): RIGHT QUADRICEP TENDON REPAIR  The risks benefits and alternatives were discussed with the patient including but not limited to the risks of nonoperative treatment, versus surgical intervention including infection, bleeding, nerve injury, malunion, nonunion, hardware prominence, hardware failure, need for hardware removal, blood clots, cardiopulmonary complications, morbidity, mortality, among others, and they were willing to proceed.  Predicted outcome is good, although there will be at least a six to nine month expected recovery.  Dale Mitchell,Dale L, MD 08/23/2014 11:11 AM

## 2014-08-23 NOTE — Anesthesia Postprocedure Evaluation (Signed)
Anesthesia Post Note  Patient: Dale Mitchell  Procedure(s) Performed: Procedure(s) (LRB): RIGHT QUADRICEP TENDON REPAIR (Right)  Anesthesia type: General  Patient location: PACU  Post pain: Pain level controlled  Post assessment: Post-op Vital signs reviewed  Last Vitals: BP 148/63 mmHg  Pulse 84  Temp(Src) 36.9 C (Oral)  Resp 18  Ht 6\' 2"  (1.88 m)  Wt 235 lb (106.595 kg)  BMI 30.16 kg/m2  SpO2 98%  Post vital signs: Reviewed  Level of consciousness: sedated  Complications: No apparent anesthesia complications

## 2014-08-24 ENCOUNTER — Encounter (HOSPITAL_BASED_OUTPATIENT_CLINIC_OR_DEPARTMENT_OTHER): Payer: Self-pay | Admitting: Orthopedic Surgery

## 2014-08-24 NOTE — Op Note (Signed)
NAMELOVIS, MORE NO.:  [id number]  MEDICAL RECORD NO.:  [id number]  LOCATION:  PERIO                        FACILITY:  [facility]  PHYSICIAN:  Alta Corning, M.D.   DATE OF BIRTH:  [birth year]  DATE OF PROCEDURE:  08/23/2014 DATE OF DISCHARGE:                              OPERATIVE REPORT   PREOPERATIVE DIAGNOSIS:  Right quadriceps tendon rupture with retinacular rupture.  POSTOPERATIVE DIAGNOSIS:  Right quadriceps tendon rupture with retinacular rupture.  PROCEDURE:  Right quadriceps tendon repair with FiberWire sutures through drill holes and the retinacular repair.  SURGEON:  Alta Corning, M.D.  ASSISTANT:  Gary Fleet, P.A.  ANESTHESIA:  General.  BRIEF HISTORY:  Mr. Ciullo is a 64 year old male with a history of falling and then having inability to lift the leg and bend the leg.  He was evaluated by an outside facility.  He was uncertain of his diagnosis. He was sent over for evaluation at that point.  It was certain to me that he had a quadriceps tendon rupture.  He had an MRI because of the concerns about diagnosis and MRI showed he had a quad tendon rupture and retinacular disruption.  He was taken to the operating room for repair of this after discussion of treatment options.  DESCRIPTION OF PROCEDURE:  The patient was taken to the operating room, after adequate anesthesia was obtained with general anesthetic, the patient was placed supine on the operating table.  The right leg was prepped and draped in sterile fashion.  Following this, the leg was exsanguinated and blood pressure tourniquet inflated to 300 mmHg. Following this, midline incision was made in the subcutaneous tissue down the level of the extensor mechanism.  Extensor mechanism was identified and it was a widened extensor mechanism and at this point, I felt like I could get a #5 FiberWire in a Bunnell-type fashion down the central portion of #2 FiberWire down the medial and  lateral aspects of the quad tendon.  Four drill holes were made through bone and the sutures were passed through the drill holes, the #5, and the central 2 holes, and #2 in the medial and lateral set of holes.  The sutures were repaired and an excellent repair was achieved with about 70 degrees of flexion capable before there was a gap in the tendon.  At this point, the retinaculum was repaired with 1 Vicryl running.  The skin was then closed with 0 and 2-0 Vicryl and 3-0 Monocryl subcuticular.  Benzoin and Steri-Strips were applied.  Sterile compressive dressing was applied and the patient was taken to recovery and was noted to be in satisfactory condition. Estimated blood loss for the procedure was amenable.     Alta Corning, M.D.     Corliss Skains  D:  08/23/2014  T:  08/24/2014  Job:  [id number]

## 2014-08-28 LAB — POCT HEMOGLOBIN-HEMACUE: Hemoglobin: 14.1 g/dL (ref 13.0–17.0)

## 2014-09-26 ENCOUNTER — Telehealth: Payer: Self-pay

## 2014-09-26 NOTE — Telephone Encounter (Signed)
Crystal at [employer] for [facility] left message in referrals in regards to patient for DME equipment. Per Crystal this is the 2nd request for more clinical information. She stated she faxed our office this Friday at [phone]. Her fax number is [phone] and her office number is [phone]

## 2014-09-27 NOTE — Telephone Encounter (Signed)
We did not receive a fax that I am aware of so not sure what equipment she is speaking of. According to OV notes, pt was referred to Dr Dorna Leitz, ortho, and if the device is related to knee, his office should be contacted. LMOM for Crystal at [facility] w/Dr Graves contact info, and asked for CB if it is for something else.

## 2015-04-06 ENCOUNTER — Encounter: Payer: Self-pay | Admitting: Family Medicine

## 2015-04-24 ENCOUNTER — Encounter: Payer: Self-pay | Admitting: Family Medicine

## 2015-04-25 ENCOUNTER — Encounter: Payer: Self-pay | Admitting: Family Medicine

## 2015-04-25 ENCOUNTER — Ambulatory Visit (INDEPENDENT_AMBULATORY_CARE_PROVIDER_SITE_OTHER): Payer: 59 | Admitting: Family Medicine

## 2015-04-25 VITALS — BP 142/80 | HR 71 | Temp 97.9°F | Resp 16 | Wt 226.4 lb

## 2015-04-25 DIAGNOSIS — Z125 Encounter for screening for malignant neoplasm of prostate: Secondary | ICD-10-CM

## 2015-04-25 DIAGNOSIS — Z130 Encounter for screening for diseases of the blood and blood-forming organs and certain disorders involving the immune mechanism: Secondary | ICD-10-CM

## 2015-04-25 DIAGNOSIS — Z131 Encounter for screening for diabetes mellitus: Secondary | ICD-10-CM | POA: Diagnosis not present

## 2015-04-25 DIAGNOSIS — Z119 Encounter for screening for infectious and parasitic diseases, unspecified: Secondary | ICD-10-CM | POA: Diagnosis not present

## 2015-04-25 DIAGNOSIS — IMO0001 Reserved for inherently not codable concepts without codable children: Secondary | ICD-10-CM

## 2015-04-25 DIAGNOSIS — R030 Elevated blood-pressure reading, without diagnosis of hypertension: Secondary | ICD-10-CM | POA: Diagnosis not present

## 2015-04-25 DIAGNOSIS — Z23 Encounter for immunization: Secondary | ICD-10-CM

## 2015-04-25 DIAGNOSIS — E663 Overweight: Secondary | ICD-10-CM | POA: Diagnosis not present

## 2015-04-25 DIAGNOSIS — R972 Elevated prostate specific antigen [PSA]: Secondary | ICD-10-CM

## 2015-04-25 DIAGNOSIS — Z13220 Encounter for screening for lipoid disorders: Secondary | ICD-10-CM | POA: Diagnosis not present

## 2015-04-25 LAB — CBC
HCT: 42.1 % (ref 39.0–52.0)
Hemoglobin: 14.1 g/dL (ref 13.0–17.0)
MCH: 30.1 pg (ref 26.0–34.0)
MCHC: 33.5 g/dL (ref 30.0–36.0)
MCV: 90 fL (ref 78.0–100.0)
MPV: 8.9 fL (ref 8.6–12.4)
PLATELETS: 283 10*3/uL (ref 150–400)
RBC: 4.68 MIL/uL (ref 4.22–5.81)
RDW: 12.5 % (ref 11.5–15.5)
WBC: 5.9 10*3/uL (ref 4.0–10.5)

## 2015-04-25 LAB — COMPREHENSIVE METABOLIC PANEL
ALBUMIN: 4.2 g/dL (ref 3.6–5.1)
ALT: 12 U/L (ref 9–46)
AST: 14 U/L (ref 10–35)
Alkaline Phosphatase: 23 U/L — ABNORMAL LOW (ref 40–115)
BUN: 20 mg/dL (ref 7–25)
CHLORIDE: 105 mmol/L (ref 98–110)
CO2: 29 mmol/L (ref 20–31)
Calcium: 9 mg/dL (ref 8.6–10.3)
Creat: 0.89 mg/dL (ref 0.70–1.25)
GLUCOSE: 100 mg/dL — AB (ref 65–99)
Potassium: 4.8 mmol/L (ref 3.5–5.3)
Sodium: 140 mmol/L (ref 135–146)
Total Bilirubin: 0.6 mg/dL (ref 0.2–1.2)
Total Protein: 6.6 g/dL (ref 6.1–8.1)

## 2015-04-25 LAB — HEPATITIS C ANTIBODY: HCV AB: NEGATIVE

## 2015-04-25 LAB — HEMOGLOBIN A1C
Hgb A1c MFr Bld: 5.6 % (ref <5.7)
MEAN PLASMA GLUCOSE: 114 mg/dL (ref <117)

## 2015-04-25 LAB — LDL CHOLESTEROL, DIRECT: Direct LDL: 151 mg/dL — ABNORMAL HIGH (ref <130)

## 2015-04-25 NOTE — Progress Notes (Addendum)
[facility] [address] [place] [address] [phone]  Date:  04/25/2015   Name:  Dale Mitchell   DOB:  1950-08-24   MRN:  [id number]  PCP:  Lamar Blinks, MD    Chief Complaint: wants to be checked out   History of Present Illness:  Dale Mitchell is a 64 y.o. very pleasant male patient who presents with the following:  Here today for a follow-up visit.  Last CPE was in 2014.  I saw him in February when he ruptured his quad; this was repaired by Dr. Berenice Primas.  His leg is doing very well. He is done with PT.  Not having any pain really. He is able to do  exercise at the gym- using the eliptical, etc but he has not tried running on his leg as of yet  He needs follow-up for his HTN, flu shot, and colonoscopy.   His last colonoscpy was in 2012- he was given a 5 year pass on this.    He has been treated for HTN in the past, but then lost weight.  He was given lisinopril in the past, but does not think he ever really took it.  He did lose a significant amoutn of weight over the last few years- he has lost about 40 lbs total  He had some eggs and coffee this am Flu shot today  BP Readings from Last 3 Encounters:  04/25/15 150/78  08/23/14 172/74  08/11/14 138/72     Patient Active Problem List   Diagnosis Date Noted  . HYPERLIPIDEMIA-MIXED 08/23/2010  . HYPERTENSION, UNSPECIFIED 08/23/2010  . CARDIAC MURMUR 08/23/2010    Past Medical History  Diagnosis Date  . HLD (hyperlipidemia)   . Obesity   . Colon polyp   . HTN (hypertension)     no meds  . Murmur     no echo ever done    Past Surgical History  Procedure Laterality Date  . Colonoscopy    . Wisdom tooth extraction    . Orif finger / thumb fracture  1995    right  . Quadriceps tendon repair Right 08/23/2014    Procedure: RIGHT QUADRICEP TENDON REPAIR;  Surgeon: Alta Corning, MD;  Location: [facility];  Service: Orthopedics;  Laterality: Right;    Social History   Substance Use Topics  . Smoking status: Former Smoker    Quit date: 06/30/1993  . Smokeless tobacco: None  . Alcohol Use: Yes     Comment: rare    Family History  Problem Relation Age of Onset  . Heart failure Father   . Emphysema Father   . Cancer Mother     Not on File  Medication list has been reviewed and updated.  Current Outpatient Prescriptions on File Prior to Visit  Medication Sig Dispense Refill  . oxyCODONE-acetaminophen (PERCOCET/ROXICET) 5-325 MG per tablet Take by mouth every 4 (four) hours as needed for severe pain.     No current facility-administered medications on file prior to visit.    Review of Systems:  As per HPI- otherwise negative.   Physical Examination: Filed Vitals:   04/25/15 0924  BP: 150/78  Pulse:   Temp:   Resp:    Filed Vitals:   04/25/15 0920  Weight: 226 lb 6.4 oz (102.694 kg)   Body mass index is 29.06 kg/(m^2). Ideal Body Weight:    GEN: WDWN, NAD, Non-toxic, A & O x 3, looks well, overweight but not  obese HEENT: Atraumatic, Normocephalic. Neck supple. No masses, No LAD. Ears and Nose: No external deformity. CV: RRR, No M/G/R. No JVD. No thrill. No extra heart sounds. PULM: CTA B, no wheezes, crackles, rhonchi. No retractions. No resp. distress. No accessory muscle use. EXTR: No c/c/e NEURO Normal gait.  PSYCH: Normally interactive. Conversant. Not depressed or anxious appearing.  Calm demeanor.  Right leg is now functional- well healed scar from quad repair    Assessment and Plan: Elevated BP  Screening for prostate cancer - Plan: PSA  Screening for hyperlipidemia - Plan: LDL cholesterol, direct  Screening for diabetes mellitus - Plan: Comprehensive metabolic panel, Hemoglobin A1c  Screening for deficiency anemia - Plan: CBC  Screening examination for infectious disease - Plan: Hepatitis C antibody  Immunization due - Plan: Flu Vaccine QUAD 36+ mos IM  Here today as he was called in to "close gaps" for  insurance company Labs pending Did flu shot Reminded to schedule colonoscopy Prior dx of HTN, but he has lost about 40 lbs.  He would like to monitor his BP at the [facility] and let me know how it looks- we will decide if he needs medication with some ambulatory readings.    Signed Lamar Blinks, MD  Received labs- will mail him a letter.  Place order for repeat PSA in the next 3-4 months   Results for orders placed or performed in visit on 04/25/15  CBC  Result Value Ref Range   WBC 5.9 4.0 - 10.5 K/uL   RBC 4.68 4.22 - 5.81 MIL/uL   Hemoglobin 14.1 13.0 - 17.0 g/dL   HCT 42.1 39.0 - 52.0 %   MCV 90.0 78.0 - 100.0 fL   MCH 30.1 26.0 - 34.0 pg   MCHC 33.5 30.0 - 36.0 g/dL   RDW 12.5 11.5 - 15.5 %   Platelets 283 150 - 400 K/uL   MPV 8.9 8.6 - 12.4 fL  Comprehensive metabolic panel  Result Value Ref Range   Sodium 140 135 - 146 mmol/L   Potassium 4.8 3.5 - 5.3 mmol/L   Chloride 105 98 - 110 mmol/L   CO2 29 20 - 31 mmol/L   Glucose, Bld 100 (H) 65 - 99 mg/dL   BUN 20 7 - 25 mg/dL   Creat 0.89 0.70 - 1.25 mg/dL   Total Bilirubin 0.6 0.2 - 1.2 mg/dL   Alkaline Phosphatase 23 (L) 40 - 115 U/L   AST 14 10 - 35 U/L   ALT 12 9 - 46 U/L   Total Protein 6.6 6.1 - 8.1 g/dL   Albumin 4.2 3.6 - 5.1 g/dL   Calcium 9.0 8.6 - 10.3 mg/dL  Hemoglobin A1c  Result Value Ref Range   Hgb A1c MFr Bld 5.6 <5.7 %   Mean Plasma Glucose 114 <117 mg/dL  PSA  Result Value Ref Range   PSA 3.60 <=4.00 ng/mL  LDL cholesterol, direct  Result Value Ref Range   Direct LDL 151 (H) <130 mg/dL  Hepatitis C antibody  Result Value Ref Range   HCV Ab NEGATIVE NEGATIVE

## 2015-04-25 NOTE — Patient Instructions (Signed)
It was good to see you today- I am so glad that your leg is better!  I will be in touch with your labs asap Your colonoscopy is coming due next February- Dr. Earlean Shawl   Dr. Mayme Genta, MD ? Address: [address] [place], [place] [address] Phone: [phone]  [facility]  We would like to see you 130/85 or less on average.  Please send me an email with a few readings when you can

## 2015-04-26 LAB — PSA: PSA: 3.6 ng/mL (ref <=4.00)

## 2015-04-27 ENCOUNTER — Encounter: Payer: Self-pay | Admitting: Family Medicine

## 2015-04-27 NOTE — Addendum Note (Signed)
Addended by: Lamar Blinks C on: 04/27/2015 10:21 AM   Modules accepted: Orders

## 2015-04-27 NOTE — Addendum Note (Signed)
Addended by: Lamar Blinks C on: 04/27/2015 10:24 AM   Modules accepted: Orders

## 2015-07-20 ENCOUNTER — Encounter: Payer: Self-pay | Admitting: Family Medicine

## 2015-07-25 ENCOUNTER — Encounter: Payer: Self-pay | Admitting: Family Medicine

## 2018-08-13 ENCOUNTER — Ambulatory Visit (INDEPENDENT_AMBULATORY_CARE_PROVIDER_SITE_OTHER): Payer: Medicare HMO | Admitting: Family Medicine

## 2018-08-13 ENCOUNTER — Other Ambulatory Visit: Payer: Self-pay

## 2018-08-13 ENCOUNTER — Encounter: Payer: Self-pay | Admitting: Family Medicine

## 2018-08-13 ENCOUNTER — Ambulatory Visit: Payer: Self-pay | Admitting: Family Medicine

## 2018-08-13 VITALS — BP 142/80 | HR 66 | Temp 98.4°F | Resp 17 | Ht 74.0 in | Wt 233.4 lb

## 2018-08-13 DIAGNOSIS — Z1211 Encounter for screening for malignant neoplasm of colon: Secondary | ICD-10-CM | POA: Diagnosis not present

## 2018-08-13 DIAGNOSIS — Z85828 Personal history of other malignant neoplasm of skin: Secondary | ICD-10-CM

## 2018-08-13 DIAGNOSIS — Z125 Encounter for screening for malignant neoplasm of prostate: Secondary | ICD-10-CM

## 2018-08-13 DIAGNOSIS — I10 Essential (primary) hypertension: Secondary | ICD-10-CM

## 2018-08-13 DIAGNOSIS — Z1283 Encounter for screening for malignant neoplasm of skin: Secondary | ICD-10-CM

## 2018-08-13 DIAGNOSIS — R399 Unspecified symptoms and signs involving the genitourinary system: Secondary | ICD-10-CM

## 2018-08-13 DIAGNOSIS — E782 Mixed hyperlipidemia: Secondary | ICD-10-CM

## 2018-08-13 DIAGNOSIS — R972 Elevated prostate specific antigen [PSA]: Secondary | ICD-10-CM

## 2018-08-13 DIAGNOSIS — K635 Polyp of colon: Secondary | ICD-10-CM | POA: Diagnosis not present

## 2018-08-13 MED ORDER — AMLODIPINE BESYLATE 5 MG PO TABS: 5.0000 mg | ORAL_TABLET | Freq: Every day | ORAL | 3 refills | Status: DC

## 2018-08-13 NOTE — Patient Instructions (Addendum)
Amlodipine 5mg   Take one tablet by mouth daily Continue exercise Take your blood pressure before exercise and after twice a week until your follow up and bring those readings   Return to clinic in one month    If you have lab work done today you will be contacted with your lab results within the next 2 weeks.  If you have not heard from [place] then please contact us. The fastest way to get your results is to register for My Chart.   IF you received an x-ray today, you will receive an invoice from [facility]. Please contact [facility] at [phone] with questions or concerns regarding your invoice.   IF you received labwork today, you will receive an invoice from [facility]. Please contact [facility] at [phone] with questions or concerns regarding your invoice.   Our billing staff will not be able to assist you with questions regarding bills from these companies.  You will be contacted with the lab results as soon as they are available. The fastest way to get your results is to activate your My Chart account. Instructions are located on the last page of this paperwork. If you have not heard from [place] regarding the results in 2 weeks, please contact this office.     How to Take Your Blood Pressure You can take your blood pressure at home with a machine. You may need to check your blood pressure at home:  To check if you have high blood pressure (hypertension).  To check your blood pressure over time.  To make sure your blood pressure medicine is working. Supplies needed: You will need a blood pressure machine, or monitor. You can buy one at a drugstore or online. When choosing one:  Choose one with an arm cuff.  Choose one that wraps around your upper arm. Only one finger should fit between your arm and the cuff.  Do not choose one that measures your blood pressure from your wrist or finger. Your doctor can suggest a monitor. How to prepare Avoid these things  for 30 minutes before checking your blood pressure:  Drinking caffeine.  Drinking alcohol.  Eating.  Smoking.  Exercising. Five minutes before checking your blood pressure:  Pee.  Sit in a dining chair. Avoid sitting in a soft couch or armchair.  Be quiet. Do not talk. How to take your blood pressure Follow the instructions that came with your machine. If you have a digital blood pressure monitor, these may be the instructions: 1. Sit up straight. 2. Place your feet on the floor. Do not cross your ankles or legs. 3. Rest your left arm at the level of your heart. You may rest it on a table, desk, or chair. 4. Pull up your shirt sleeve. 5. Wrap the blood pressure cuff around the upper part of your left arm. The cuff should be 1 inch (2.5 cm) above your elbow. It is best to wrap the cuff around bare skin. 6. Fit the cuff snugly around your arm. You should be able to place only one finger between the cuff and your arm. 7. Put the cord inside the groove of your elbow. 8. Press the power button. 9. Sit quietly while the cuff fills with air and loses air. 10. Write down the numbers on the screen. 11. Wait 2-3 minutes and then repeat steps 1-10. What do the numbers mean? Two numbers make up your blood pressure. The first number is called systolic pressure. The second is called diastolic  pressure. An example of a blood pressure reading is "120 over 80" (or 120/80). If you are an adult and do not have a medical condition, use this guide to find out if your blood pressure is normal: Normal  First number: below 120.  Second number: below 80. Elevated  First number: 120-129.  Second number: below 80. Hypertension stage 1  First number: 130-139.  Second number: 80-89. Hypertension stage 2  First number: 140 or above.  Second number: 83 or above. Your blood pressure is above normal even if only the top or bottom number is above normal. Follow these instructions at  home:  Check your blood pressure as often as your doctor tells you to.  Take your monitor to your next doctor's appointment. Your doctor will: ? Make sure you are using it correctly. ? Make sure it is working right.  Make sure you understand what your blood pressure numbers should be.  Tell your doctor if your medicines are causing side effects. Contact a doctor if:  Your blood pressure keeps being high. Get help right away if:  Your first blood pressure number is higher than 180.  Your second blood pressure number is higher than 120. This information is not intended to replace advice given to you by your health care provider. Make sure you discuss any questions you have with your health care provider. Document Released: 05/29/2008 Document Revised: 05/14/2016 Document Reviewed: 11/23/2015 Elsevier Interactive Patient Education  2019 [employer].

## 2018-08-13 NOTE — Progress Notes (Signed)
New Patient Office Visit  Subjective:  Patient ID: Dale Mitchell, male    DOB: [birth year]  Age: 68 y.o. MRN: [id number]  CC:  Chief Complaint  Patient presents with  . Establish Care    no concerns per  pt    HPI VYRON FRONCZAK presents for   Hypertension: Patient here for follow-up of elevated blood pressure. He is exercising and is adherent to low salt diet.  Blood pressure is not well controlled at home.  [facility] are systolic 086V. Cardiac symptoms none. Patient denies chest pain, claudication, dyspnea, fatigue, irregular heart beat, near-syncope and orthopnea.  Cardiovascular risk factors: advanced age (older than 80 for men, 69 for women), hypertension and male gender. Use of agents associated with hypertension: none. History of target organ damage: none. BP Readings from Last 3 Encounters:  08/13/18 (!) 142/80  04/25/15 (!) 142/80  08/23/14 (!) 172/74   He was treating his blood pressure with lifestyle modification He states that in 2019 he was not exercising as much and since 2020 he has increased his exercise since he is using his silver sneakers   Hyperlipidemia: Patient presents with hyperlipidemia.  He was tested because routine check up.  His last labs showed Total cholesterol of see below. negative. There is not a family history of hyperlipidemia. There is not a family history of early ischemia heart disease.  BP Readings from Last 3 Encounters:  08/13/18 (!) 142/80  04/25/15 (!) 142/80  08/23/14 (!) 172/74   Component     Latest Ref Rng & Units 04/25/2015  Direct LDL     <130 mg/dL 151 (H)   Lab Results  Component Value Date   LDLCALC 121 (H) 04/15/2013    Colon polyp Pt had colonoscopy in 2012 He was to follow up in 5 years but has not He has a family history of breast cancer Personal history of basal cell  Denies blood per rectum, unexplained weight loss, or stool changes He consumes fiber  Past Medical History:    Diagnosis Date  . Colon polyp   . HLD (hyperlipidemia)   . HTN (hypertension)    no meds  . Murmur    no echo ever done  . Obesity     Past Surgical History:  Procedure Laterality Date  . COLONOSCOPY    . ORIF FINGER / [place]   right  . QUADRICEPS TENDON REPAIR Right 08/23/2014   Procedure: RIGHT QUADRICEP TENDON REPAIR;  Surgeon: Alta Corning, MD;  Location: [place];  Service: Orthopedics;  Laterality: Right;  . WISDOM TOOTH EXTRACTION      Family History  Problem Relation Age of Onset  . Heart failure Father   . Emphysema Father   . Cancer Mother     Social History   Socioeconomic History  . Marital status: Married    Spouse name: Not on file  . Number of children: Not on file  . Years of education: Not on file  . Highest education level: Not on file  Occupational History  . Occupation: Geographical information systems officer  . Financial resource strain: Not on file  . Food insecurity:    Worry: Not on file    Inability: Not on file  . Transportation needs:    Medical: Not on file    Non-medical: Not on file  Tobacco Use  . Smoking status: Former Smoker    Last  attempt to quit: 06/30/1993    Years since quitting: 25.1  . Smokeless tobacco: Never Used  Substance and Sexual Activity  . Alcohol use: Yes    Comment: rare  . Drug use: No  . Sexual activity: Not on file  Lifestyle  . Physical activity:    Days per week: Not on file    Minutes per session: Not on file  . Stress: Not on file  Relationships  . Social connections:    Talks on phone: Not on file    Gets together: Not on file    Attends religious service: Not on file    Active member of club or organization: Not on file    Attends meetings of clubs or organizations: Not on file    Relationship status: Not on file  . Intimate partner violence:    Fear of current or ex partner: Not on file    Emotionally abused: Not on file    Physically abused: Not on file    Forced sexual  activity: Not on file  Other Topics Concern  . Not on file  Social History Narrative  . Not on file    ROS Review of Systems Review of Systems  Constitutional: Negative for activity change, appetite change, chills and fever.  HENT: Negative for congestion, nosebleeds, trouble swallowing and voice change.   Respiratory: Negative for cough, shortness of breath and wheezing.   Gastrointestinal: Negative for diarrhea, nausea and vomiting.  Genitourinary: Negative for difficulty urinating, dysuria, flank pain and hematuria.  Musculoskeletal: Negative for back pain, joint swelling and neck pain.  Neurological: Negative for dizziness, speech difficulty, light-headedness and numbness.  See HPI. All other review of systems negative.   Objective:   Today's Vitals: BP (!) 142/80 (BP Location: Left Arm, Patient Position: Sitting, Cuff Size: Large)   Pulse 66   Temp 98.4 F (36.9 C) (Oral)   Resp 17   Ht '6\' 2"'  (1.88 m)   Wt 233 lb 6.4 oz (105.9 kg)   SpO2 95%   BMI 29.97 kg/m  Wt Readings from Last 3 Encounters:  08/13/18 233 lb 6.4 oz (105.9 kg)  04/25/15 226 lb 6.4 oz (102.7 kg)  08/23/14 235 lb (106.6 kg)    Physical Exam  Physical Exam  Constitutional: Oriented to person, place, and time. Appears well-developed and well-nourished.  HENT:  Head: Normocephalic and atraumatic.  Eyes: Conjunctivae and EOM are normal.  Cardiovascular: Normal rate, regular rhythm, normal heart sounds and intact distal pulses.  No murmur heard. Pulmonary/Chest: Effort normal and breath sounds normal. No stridor. No respiratory distress. Has no wheezes.  Abdomen: nondistended, normoactive bs, soft, nontender Neurological: Is alert and oriented to person, place, and time.  Skin: Skin is warm. Capillary refill takes less than 2 seconds.  Psychiatric: Has a normal mood and affect. Behavior is normal. Judgment and thought content normal.   Assessment & Plan:   Problem List Items Addressed This Visit     None    Visit Diagnoses    Benign colon polyp    -  Primary   Relevant Orders   Ambulatory referral to Gastroenterology   Screening for colon cancer    -discussed colon cancer screening and reviewed last colonoscopy   Relevant Orders   Ambulatory referral to Gastroenterology   Encounter for screening for malignant neoplasm of prostate       Relevant Orders   PSA   Lower urinary tract symptoms (LUTS)    - will check PSA  Relevant Orders   PSA   POCT urinalysis dipstick   Mixed hyperlipidemia    - will assess lipid control   Relevant Medications   amLODipine (NORVASC) 5 MG tablet   Other Relevant Orders   Lipid panel   CMP14+EGFR   Essential hypertension    -  Stage I hypertension Will start amlodipine at low dose in addition to lifestyle modification and exercise    Relevant Medications   amLODipine (NORVASC) 5 MG tablet   Other Relevant Orders   Lipid panel   CMP14+EGFR   Microalbumin, urine   History of basal cell carcinoma       Relevant Orders   Ambulatory referral to Dermatology   Skin cancer screening       Relevant Orders   Ambulatory referral to Dermatology      Outpatient Encounter Medications as of 08/13/2018  Medication Sig  . amLODipine (NORVASC) 5 MG tablet Take 1 tablet (5 mg total) by mouth daily.  Marland Kitchen oxyCODONE-acetaminophen (PERCOCET/ROXICET) 5-325 MG per tablet Take by mouth every 4 (four) hours as needed for severe pain.   No facility-administered encounter medications on file as of 08/13/2018.     Follow-up: No follow-ups on file.   Forrest Moron, MD

## 2018-08-14 LAB — LIPID PANEL
Chol/HDL Ratio: 3.5 ratio (ref 0.0–5.0)
Cholesterol, Total: 214 mg/dL — ABNORMAL HIGH (ref 100–199)
HDL: 61 mg/dL (ref >39)
LDL CALC: 135 mg/dL — AB (ref 0–99)
Triglycerides: 90 mg/dL (ref 0–149)
VLDL Cholesterol Cal: 18 mg/dL (ref 5–40)

## 2018-08-14 LAB — PSA: Prostate Specific Ag, Serum: 4.4 ng/mL — ABNORMAL HIGH (ref 0.0–4.0)

## 2018-08-14 LAB — CMP14+EGFR
ALT: 15 IU/L (ref 0–44)
AST: 17 IU/L (ref 0–40)
Albumin/Globulin Ratio: 2.2 (ref 1.2–2.2)
Albumin: 4.8 g/dL (ref 3.8–4.8)
Alkaline Phosphatase: 28 IU/L — ABNORMAL LOW (ref 39–117)
BUN/Creatinine Ratio: 15 (ref 10–24)
BUN: 13 mg/dL (ref 8–27)
Bilirubin Total: 0.7 mg/dL (ref 0.0–1.2)
CHLORIDE: 102 mmol/L (ref 96–106)
CO2: 23 mmol/L (ref 20–29)
Calcium: 9.3 mg/dL (ref 8.6–10.2)
Creatinine, Ser: 0.86 mg/dL (ref 0.76–1.27)
GFR calc Af Amer: 104 mL/min/{1.73_m2} (ref >59)
GFR, EST NON AFRICAN AMERICAN: 90 mL/min/{1.73_m2} (ref >59)
GLOBULIN, TOTAL: 2.2 g/dL (ref 1.5–4.5)
Glucose: 93 mg/dL (ref 65–99)
Potassium: 4.1 mmol/L (ref 3.5–5.2)
Sodium: 139 mmol/L (ref 134–144)
Total Protein: 7 g/dL (ref 6.0–8.5)

## 2018-08-14 LAB — MICROALBUMIN, URINE: Microalbumin, Urine: <3 ug/mL

## 2018-09-17 ENCOUNTER — Ambulatory Visit: Payer: Medicare HMO | Admitting: Family Medicine

## 2018-11-30 ENCOUNTER — Ambulatory Visit: Payer: Medicare HMO | Admitting: Registered Nurse

## 2018-12-01 ENCOUNTER — Ambulatory Visit (INDEPENDENT_AMBULATORY_CARE_PROVIDER_SITE_OTHER): Payer: Medicare HMO | Admitting: Family Medicine

## 2018-12-01 ENCOUNTER — Encounter: Payer: Self-pay | Admitting: Family Medicine

## 2018-12-01 ENCOUNTER — Other Ambulatory Visit: Payer: Self-pay

## 2018-12-01 VITALS — BP 144/82 | HR 73 | Temp 98.4°F | Ht 74.0 in | Wt 233.0 lb

## 2018-12-01 DIAGNOSIS — H6123 Impacted cerumen, bilateral: Secondary | ICD-10-CM

## 2018-12-01 DIAGNOSIS — H60391 Other infective otitis externa, right ear: Secondary | ICD-10-CM | POA: Diagnosis not present

## 2018-12-01 DIAGNOSIS — H6120 Impacted cerumen, unspecified ear: Secondary | ICD-10-CM

## 2018-12-01 MED ORDER — OFLOXACIN 0.3 % OT SOLN: 5.0000 [drp] | Freq: Two times a day (BID) | OTIC | 0 refills | Status: DC

## 2018-12-01 NOTE — Progress Notes (Signed)
Acute Office Visit  Subjective:    Patient ID: Dale Mitchell, male    DOB: June 30, 1951, 69 y.o.   MRN: [id number]  Chief Complaint  Patient presents with  . Ear Fullness    X 2 weeks    HPI Patient is in today for bilat ear fullness. Pt with difficulty hearing.  No ear pain. No hearing aids  Past Medical History:  Diagnosis Date  . Colon polyp   . HLD (hyperlipidemia)   . HTN (hypertension)    no meds  . Murmur    no echo ever done  . Obesity     Past Surgical History:  Procedure Laterality Date  . COLONOSCOPY    . ORIF FINGER / [place]   right  . QUADRICEPS TENDON REPAIR Right 08/23/2014   Procedure: RIGHT QUADRICEP TENDON REPAIR;  Surgeon: Alta Corning, MD;  Location: [place];  Service: Orthopedics;  Laterality: Right;  . WISDOM TOOTH EXTRACTION      Family History  Problem Relation Age of Onset  . Heart failure Father   . Emphysema Father   . Cancer Mother     Social History   Socioeconomic History  . Marital status: Married    Spouse name: Not on file  . Number of children: 2  . Years of education: Not on file  . Highest education level: Not on file  Occupational History  . Occupation: Geographical information systems officer  . Financial resource strain: Not on file  . Food insecurity:    Worry: Not on file    Inability: Not on file  . Transportation needs:    Medical: Not on file    Non-medical: Not on file  Tobacco Use  . Smoking status: Former Smoker    Last attempt to quit: 06/30/1993    Years since quitting: 25.4  . Smokeless tobacco: Never Used  Substance and Sexual Activity  . Alcohol use: Yes    Comment: rare  . Drug use: No  . Sexual activity: Yes  Lifestyle  . Physical activity:    Days per week: Not on file    Minutes per session: Not on file  . Stress: Not on file  Relationships  . Social connections:    Talks on phone: Not on file    Gets together: Not on file    Attends religious service: Not on file    Active  member of club or organization: Not on file    Attends meetings of clubs or organizations: Not on file    Relationship status: Not on file  . Intimate partner violence:    Fear of current or ex partner: Not on file    Emotionally abused: Not on file    Physically abused: Not on file    Forced sexual activity: Not on file  Other Topics Concern  . Not on file  Social History Narrative  . Not on file    Outpatient Medications Prior to Visit  Medication Sig Dispense Refill  . amLODipine (NORVASC) 5 MG tablet Take 1 tablet (5 mg total) by mouth daily. 30 tablet 3  . oxyCODONE-acetaminophen (PERCOCET/ROXICET) 5-325 MG per tablet Take by mouth every 4 (four) hours as needed for severe pain.     No facility-administered medications prior to visit.     No Known Allergies  Review of Systems  HENT: Positive for ear pain. Negative for ear discharge, sinus pain and sore throat.   Respiratory: Negative for  wheezing.        Objective:    Physical Exam  Constitutional: He appears well-developed and well-nourished.  HENT:  Head: Normocephalic.  Mouth/Throat: Oropharynx is clear and moist.  bilat cerumen inpaction-after clearing eryth right ear canal  Pulse 73   Temp 98.4 F (36.9 C) (Oral)   SpO2 94%  Wt Readings from Last 3 Encounters:  08/13/18 233 lb 6.4 oz (105.9 kg)  04/25/15 226 lb 6.4 oz (102.7 kg)  08/23/14 235 lb (106.6 kg)    Health Maintenance Due  Topic Date Due  . PNA vac Low Risk Adult (1 of 2 - PCV13) 11/16/2016     No results found for: TSH Lab Results  Component Value Date   WBC 5.9 04/25/2015   HGB 14.1 04/25/2015   HCT 42.1 04/25/2015   MCV 90.0 04/25/2015   PLT 283 04/25/2015   Lab Results  Component Value Date   NA 139 08/13/2018   K 4.1 08/13/2018   CO2 23 08/13/2018   GLUCOSE 93 08/13/2018   BUN 13 08/13/2018   CREATININE 0.86 08/13/2018   BILITOT 0.7 08/13/2018   ALKPHOS 28 (L) 08/13/2018   AST 17 08/13/2018   ALT 15 08/13/2018   PROT  7.0 08/13/2018   ALBUMIN 4.8 08/13/2018   CALCIUM 9.3 08/13/2018   Lab Results  Component Value Date   CHOL 214 (H) 08/13/2018   Lab Results  Component Value Date   HDL 61 08/13/2018   Lab Results  Component Value Date   LDLCALC 135 (H) 08/13/2018   Lab Results  Component Value Date   TRIG 90 08/13/2018   Lab Results  Component Value Date   CHOLHDL 3.5 08/13/2018   Lab Results  Component Value Date   HGBA1C 5.6 04/25/2015       Assessment & Plan:   1. Impacted cerumen, unspecified laterality cleared  2. Infective otitis externa of right ear oflox-rx-d/w pt no swimming-avoid water in ear  Prudence Heiny Hannah Beat, MD

## 2018-12-01 NOTE — Patient Instructions (Addendum)
  [facility] [address], [place], [place] [address]  Phone [phone] [facility] [address], [place], [place] [address] Phone: [phone]  Pick up rx for ear drops-oflox at pharmacy Dont forget to call derm for evaluation of skin lesion!! Pick up drops to soften wax for future use If you have lab work done today you will be contacted with your lab results within the next 2 weeks.  If you have not heard from [place] then please contact us. The fastest way to get your results is to register for My Chart.   IF you received an x-ray today, you will receive an invoice from [facility]. Please contact [facility] at [phone] with questions or concerns regarding your invoice.   IF you received labwork today, you will receive an invoice from [place]. Please contact [facility] at [phone] with questions or concerns regarding your invoice.   Our billing staff will not be able to assist you with questions regarding bills from these companies.  You will be contacted with the lab results as soon as they are available. The fastest way to get your results is to activate your My Chart account. Instructions are located on the last page of this paperwork. If you have not heard from [place] regarding the results in 2 weeks, please contact this office.

## 2018-12-10 DIAGNOSIS — C44319 Basal cell carcinoma of skin of other parts of face: Secondary | ICD-10-CM | POA: Diagnosis not present

## 2018-12-10 DIAGNOSIS — C44310 Basal cell carcinoma of skin of unspecified parts of face: Secondary | ICD-10-CM | POA: Diagnosis not present

## 2018-12-10 DIAGNOSIS — Z85828 Personal history of other malignant neoplasm of skin: Secondary | ICD-10-CM | POA: Diagnosis not present

## 2019-01-03 DIAGNOSIS — C44319 Basal cell carcinoma of skin of other parts of face: Secondary | ICD-10-CM | POA: Diagnosis not present

## 2019-01-03 DIAGNOSIS — Z85828 Personal history of other malignant neoplasm of skin: Secondary | ICD-10-CM | POA: Diagnosis not present

## 2019-05-10 DIAGNOSIS — S8391XA Sprain of unspecified site of right knee, initial encounter: Secondary | ICD-10-CM | POA: Diagnosis not present

## 2019-07-27 ENCOUNTER — Ambulatory Visit (INDEPENDENT_AMBULATORY_CARE_PROVIDER_SITE_OTHER): Payer: Medicare HMO | Admitting: Family Medicine

## 2019-07-27 VITALS — BP 144/82 | Ht 74.0 in | Wt 233.0 lb

## 2019-07-27 DIAGNOSIS — Z0000 Encounter for general adult medical examination without abnormal findings: Secondary | ICD-10-CM | POA: Diagnosis not present

## 2019-07-27 NOTE — Patient Instructions (Addendum)
Thank you for taking time to come for your Medicare Wellness Visit. I appreciate your ongoing commitment to your health goals. Please review the following plan we discussed and let me know if I can assist you in the future.  Dale Kennedy LPN  Preventive Care 68 Years and Older, Male Preventive care refers to lifestyle choices and visits with your health care provider that can promote health and wellness. This includes:  A yearly physical exam. This is also called an annual well check.  Regular dental and eye exams.  Immunizations.  Screening for certain conditions.  Healthy lifestyle choices, such as diet and exercise. What can I expect for my preventive care visit? Physical exam Your health care provider will check:  Height and weight. These may be used to calculate body mass index (BMI), which is a measurement that tells if you are at a healthy weight.  Heart rate and blood pressure.  Your skin for abnormal spots. Counseling Your health care provider may ask you questions about:  Alcohol, tobacco, and drug use.  Emotional well-being.  Home and relationship well-being.  Sexual activity.  Eating habits.  History of falls.  Memory and ability to understand (cognition).  Work and work Statistician. What immunizations do I need?  Influenza (flu) vaccine  This is recommended every year. Tetanus, diphtheria, and pertussis (Tdap) vaccine  You may need a Td booster every 10 years. Varicella (chickenpox) vaccine  You may need this vaccine if you have not already been vaccinated. Zoster (shingles) vaccine  You may need this after age 66. Pneumococcal conjugate (PCV13) vaccine  One dose is recommended after age 84. Pneumococcal polysaccharide (PPSV23) vaccine  One dose is recommended after age 88. Measles, mumps, and rubella (MMR) vaccine  You may need at least one dose of MMR if you were born in 1957 or later. You may also need a second dose. Meningococcal  conjugate (MenACWY) vaccine  You may need this if you have certain conditions. Hepatitis A vaccine  You may need this if you have certain conditions or if you travel or work in places where you may be exposed to hepatitis A. Hepatitis B vaccine  You may need this if you have certain conditions or if you travel or work in places where you may be exposed to hepatitis B. Haemophilus influenzae type b (Hib) vaccine  You may need this if you have certain conditions. You may receive vaccines as individual doses or as more than one vaccine together in one shot (combination vaccines). Talk with your health care provider about the risks and benefits of combination vaccines. What tests do I need? Blood tests  Lipid and cholesterol levels. These may be checked every 5 years, or more frequently depending on your overall health.  Hepatitis C test.  Hepatitis B test. Screening  Lung cancer screening. You may have this screening every year starting at age 24 if you have a 30-pack-year history of smoking and currently smoke or have quit within the past 15 years.  Colorectal cancer screening. All adults should have this screening starting at age 52 and continuing until age 61. Your health care provider may recommend screening at age 57 if you are at increased risk. You will have tests every 1-10 years, depending on your results and the type of screening test.  Prostate cancer screening. Recommendations will vary depending on your family history and other risks.  Diabetes screening. This is done by checking your blood sugar (glucose) after you have not eaten for  a while (fasting). You may have this done every 1-3 years.  Abdominal aortic aneurysm (AAA) screening. You may need this if you are a current or former smoker.  Sexually transmitted disease (STD) testing. Follow these instructions at home: Eating and drinking  Eat a diet that includes fresh fruits and vegetables, whole grains, lean  protein, and low-fat dairy products. Limit your intake of foods with high amounts of sugar, saturated fats, and salt.  Take vitamin and mineral supplements as recommended by your health care provider.  Do not drink alcohol if your health care provider tells you not to drink.  If you drink alcohol: ? Limit how much you have to 0-2 drinks a day. ? Be aware of how much alcohol is in your drink. In the [place] one drink equals one 12 oz bottle of beer (355 mL), one 5 oz glass of wine (148 mL), or one 1 oz glass of hard liquor (44 mL). Lifestyle  Take daily care of your teeth and gums.  Stay active. Exercise for at least 30 minutes on 5 or more days each week.  Do not use any products that contain nicotine or tobacco, such as cigarettes, e-cigarettes, and chewing tobacco. If you need help quitting, ask your health care provider.  If you are sexually active, practice safe sex. Use a condom or other form of protection to prevent STIs (sexually transmitted infections).  Talk with your health care provider about taking a low-dose aspirin or statin. What's next?  Visit your health care provider once a year for a well check visit.  Ask your health care provider how often you should have your eyes and teeth checked.  Stay up to date on all vaccines. This information is not intended to replace advice given to you by your health care provider. Make sure you discuss any questions you have with your health care provider. Document Revised: 06/10/2018 Document Reviewed: 06/10/2018 Elsevier Patient Education  2020 [employer]  

## 2019-07-27 NOTE — Progress Notes (Signed)
Presents today for [employer] Visit   Date of last exam: 12/01/2018  Interpreter used for this visit?  No  I connected with  Dale Mitchell on 07/27/19 by a telephone verified that I am speaking with the correct person using two identifiers.   I discussed the limitations of evaluation and management by telemedicine. The patient expressed understanding and agreed to proceed.    Patient Care Team: Forrest Moron, MD as PCP - General (Internal Medicine)   Other items to address today:   Discussed Eye/Dental Discussed immunizations   Other Screening: Last screening for diabetes: 04/25/2015 Last lipid screening: 08/13/2018  ADVANCE DIRECTIVES: Discussed: yes On File: no Materials Provided: no  Immunization status:  Immunization History  Administered Date(s) Administered  . Influenza,inj,Quad PF,6+ Mos 04/15/2013, 04/25/2015  . Tdap 04/15/2013     Health Maintenance Due  Topic Date Due  . PNA vac Low Risk Adult (1 of 2 - PCV13) 11/16/2016     Functional Status Survey: Is the patient deaf or have difficulty hearing?: No Does the patient have difficulty seeing, even when wearing glasses/contacts?: No Does the patient have difficulty concentrating, remembering, or making decisions?: No Does the patient have difficulty walking or climbing stairs?: No Does the patient have difficulty dressing or bathing?: No Does the patient have difficulty doing errands alone such as visiting a doctor's office or shopping?: No   6CIT Screen 07/27/2019  What Year? 0 points  What month? 0 points  What time? 0 points  Count back from 20 0 points  Months in reverse 0 points  Repeat phrase 0 points  Total Score 0        Clinical Support from 07/27/2019 in [facility]  AUDIT-C Score  2       Home Environment:   Lives in two story home No trouble climbing stairs Yes scattered rugs No grab bars Adequate lighting/no clutter  Timed Warm up  N/A   Patient Active Problem List   Diagnosis Date Noted  . Impacted cerumen 12/01/2018  . Infective otitis externa of right ear 12/01/2018  . Overweight 04/25/2015  . HYPERLIPIDEMIA-MIXED 08/23/2010  . HYPERTENSION, UNSPECIFIED 08/23/2010  . CARDIAC MURMUR 08/23/2010     Past Medical History:  Diagnosis Date  . Colon polyp   . HLD (hyperlipidemia)   . HTN (hypertension)    no meds  . Murmur    no echo ever done  . Obesity      Past Surgical History:  Procedure Laterality Date  . COLONOSCOPY    . ORIF FINGER / [place]   right  . QUADRICEPS TENDON REPAIR Right 08/23/2014   Procedure: RIGHT QUADRICEP TENDON REPAIR;  Surgeon: Alta Corning, MD;  Location: [facility];  Service: Orthopedics;  Laterality: Right;  . WISDOM TOOTH EXTRACTION       Family History  Problem Relation Age of Onset  . Heart failure Father   . Emphysema Father   . Cancer Mother      Social History   Socioeconomic History  . Marital status: Married    Spouse name: Not on file  . Number of children: 2  . Years of education: Not on file  . Highest education level: Not on file  Occupational History  . Occupation: Press photographer  Tobacco Use  . Smoking status: Former Smoker    Quit date: 06/30/1993    Years since quitting: 26.0  . Smokeless tobacco: Never Used  Substance and Sexual Activity  . Alcohol use: Yes    Comment: rare  . Drug use: No  . Sexual activity: Yes  Other Topics Concern  . Not on file  Social History Narrative  . Not on file   Social Determinants of Health   Financial Resource Strain:   . Difficulty of Paying Living Expenses: Not on file  Food Insecurity:   . Worried About Charity fundraiser in the Last Year: Not on file  . Ran Out of Food in the Last Year: Not on file  Transportation Needs:   . Lack of Transportation (Medical): Not on file  . Lack of Transportation (Non-Medical): Not on file  Physical Activity:   . Days of Exercise per  Week: Not on file  . Minutes of Exercise per Session: Not on file  Stress:   . Feeling of Stress : Not on file  Social Connections:   . Frequency of Communication with Friends and Family: Not on file  . Frequency of Social Gatherings with Friends and Family: Not on file  . Attends Religious Services: Not on file  . Active Member of Clubs or Organizations: Not on file  . Attends Archivist Meetings: Not on file  . Marital Status: Not on file  Intimate Partner Violence:   . Fear of Current or Ex-Partner: Not on file  . Emotionally Abused: Not on file  . Physically Abused: Not on file  . Sexually Abused: Not on file     No Known Allergies   Prior to Admission medications   Medication Sig Start Date End Date Taking? Authorizing Provider  amLODipine (NORVASC) 5 MG tablet Take 1 tablet (5 mg total) by mouth daily. Patient not taking: Reported on 07/27/2019 08/13/18   Forrest Moron, MD  ofloxacin (FLOXIN OTIC) 0.3 % OTIC solution Place 5 drops into the right ear 2 (two) times daily. 12/01/18   Corum, Rex Kras, MD  oxyCODONE-acetaminophen (PERCOCET/ROXICET) 5-325 MG per tablet Take by mouth every 4 (four) hours as needed for severe pain.    [provider]     Depression screen [facility] 2/9 07/27/2019 12/01/2018 08/13/2018 04/25/2015  Decreased Interest 0 0 0 0  Down, Depressed, Hopeless 0 0 0 0  PHQ - 2 Score 0 0 0 0     Fall Risk  07/27/2019 12/01/2018 08/13/2018 04/25/2015  Falls in the past year? 0 0 0 Yes  Number falls in past yr: 0 0 0 1  Injury with Fall? 0 0 0 -  Follow up Falls evaluation completed;Education provided Falls evaluation completed - -      PHYSICAL EXAM: BP (!) 144/82 Comment: taken from a previous visit  Ht 6\' 2"  (1.88 m)   Wt 233 lb (105.7 kg) Comment: per patient  BMI 29.92 kg/m    Wt Readings from Last 3 Encounters:  07/27/19 233 lb (105.7 kg)  12/01/18 233 lb (105.7 kg)  08/13/18 233 lb 6.4 oz (105.9 kg)      Education/Counseling  provided regarding diet and exercise, prevention of chronic diseases, smoking/tobacco cessation, if applicable, and reviewed "Covered Medicare Preventive Services."

## 2019-12-05 DIAGNOSIS — Z0100 Encounter for examination of eyes and vision without abnormal findings: Secondary | ICD-10-CM | POA: Diagnosis not present

## 2019-12-05 DIAGNOSIS — H5213 Myopia, bilateral: Secondary | ICD-10-CM | POA: Diagnosis not present

## 2019-12-12 DIAGNOSIS — M25562 Pain in left knee: Secondary | ICD-10-CM | POA: Diagnosis not present

## 2019-12-17 DIAGNOSIS — M25562 Pain in left knee: Secondary | ICD-10-CM | POA: Diagnosis not present

## 2019-12-20 DIAGNOSIS — M25562 Pain in left knee: Secondary | ICD-10-CM | POA: Diagnosis not present

## 2019-12-21 DIAGNOSIS — S83272A Complex tear of lateral meniscus, current injury, left knee, initial encounter: Secondary | ICD-10-CM | POA: Diagnosis not present

## 2019-12-21 DIAGNOSIS — S76112A Strain of left quadriceps muscle, fascia and tendon, initial encounter: Secondary | ICD-10-CM | POA: Diagnosis not present

## 2019-12-21 DIAGNOSIS — M94262 Chondromalacia, left knee: Secondary | ICD-10-CM | POA: Diagnosis not present

## 2019-12-21 DIAGNOSIS — G8918 Other acute postprocedural pain: Secondary | ICD-10-CM | POA: Diagnosis not present

## 2019-12-27 DIAGNOSIS — S76112D Strain of left quadriceps muscle, fascia and tendon, subsequent encounter: Secondary | ICD-10-CM | POA: Diagnosis not present

## 2019-12-27 DIAGNOSIS — Z9889 Other specified postprocedural states: Secondary | ICD-10-CM | POA: Diagnosis not present

## 2019-12-27 DIAGNOSIS — M25562 Pain in left knee: Secondary | ICD-10-CM | POA: Diagnosis not present

## 2019-12-27 DIAGNOSIS — S83272D Complex tear of lateral meniscus, current injury, left knee, subsequent encounter: Secondary | ICD-10-CM | POA: Diagnosis not present

## 2020-01-18 DIAGNOSIS — M25662 Stiffness of left knee, not elsewhere classified: Secondary | ICD-10-CM | POA: Diagnosis not present

## 2020-01-18 DIAGNOSIS — R262 Difficulty in walking, not elsewhere classified: Secondary | ICD-10-CM | POA: Diagnosis not present

## 2020-01-18 DIAGNOSIS — R531 Weakness: Secondary | ICD-10-CM | POA: Diagnosis not present

## 2020-01-20 DIAGNOSIS — M25662 Stiffness of left knee, not elsewhere classified: Secondary | ICD-10-CM | POA: Diagnosis not present

## 2020-01-20 DIAGNOSIS — R262 Difficulty in walking, not elsewhere classified: Secondary | ICD-10-CM | POA: Diagnosis not present

## 2020-01-20 DIAGNOSIS — R531 Weakness: Secondary | ICD-10-CM | POA: Diagnosis not present

## 2020-01-23 DIAGNOSIS — M25662 Stiffness of left knee, not elsewhere classified: Secondary | ICD-10-CM | POA: Diagnosis not present

## 2020-01-23 DIAGNOSIS — R531 Weakness: Secondary | ICD-10-CM | POA: Diagnosis not present

## 2020-01-23 DIAGNOSIS — R262 Difficulty in walking, not elsewhere classified: Secondary | ICD-10-CM | POA: Diagnosis not present

## 2020-01-26 DIAGNOSIS — R531 Weakness: Secondary | ICD-10-CM | POA: Diagnosis not present

## 2020-01-26 DIAGNOSIS — R262 Difficulty in walking, not elsewhere classified: Secondary | ICD-10-CM | POA: Diagnosis not present

## 2020-01-26 DIAGNOSIS — M25662 Stiffness of left knee, not elsewhere classified: Secondary | ICD-10-CM | POA: Diagnosis not present

## 2020-01-30 DIAGNOSIS — R262 Difficulty in walking, not elsewhere classified: Secondary | ICD-10-CM | POA: Diagnosis not present

## 2020-01-30 DIAGNOSIS — M25662 Stiffness of left knee, not elsewhere classified: Secondary | ICD-10-CM | POA: Diagnosis not present

## 2020-01-30 DIAGNOSIS — R531 Weakness: Secondary | ICD-10-CM | POA: Diagnosis not present

## 2020-02-02 DIAGNOSIS — M25662 Stiffness of left knee, not elsewhere classified: Secondary | ICD-10-CM | POA: Diagnosis not present

## 2020-02-02 DIAGNOSIS — R531 Weakness: Secondary | ICD-10-CM | POA: Diagnosis not present

## 2020-02-02 DIAGNOSIS — R262 Difficulty in walking, not elsewhere classified: Secondary | ICD-10-CM | POA: Diagnosis not present

## 2020-02-07 DIAGNOSIS — R531 Weakness: Secondary | ICD-10-CM | POA: Diagnosis not present

## 2020-02-07 DIAGNOSIS — M25662 Stiffness of left knee, not elsewhere classified: Secondary | ICD-10-CM | POA: Diagnosis not present

## 2020-02-07 DIAGNOSIS — R262 Difficulty in walking, not elsewhere classified: Secondary | ICD-10-CM | POA: Diagnosis not present

## 2020-02-09 DIAGNOSIS — M25662 Stiffness of left knee, not elsewhere classified: Secondary | ICD-10-CM | POA: Diagnosis not present

## 2020-02-09 DIAGNOSIS — R262 Difficulty in walking, not elsewhere classified: Secondary | ICD-10-CM | POA: Diagnosis not present

## 2020-02-09 DIAGNOSIS — R531 Weakness: Secondary | ICD-10-CM | POA: Diagnosis not present

## 2020-02-14 DIAGNOSIS — R531 Weakness: Secondary | ICD-10-CM | POA: Diagnosis not present

## 2020-02-14 DIAGNOSIS — M25662 Stiffness of left knee, not elsewhere classified: Secondary | ICD-10-CM | POA: Diagnosis not present

## 2020-02-14 DIAGNOSIS — R262 Difficulty in walking, not elsewhere classified: Secondary | ICD-10-CM | POA: Diagnosis not present

## 2020-02-16 DIAGNOSIS — R531 Weakness: Secondary | ICD-10-CM | POA: Diagnosis not present

## 2020-02-16 DIAGNOSIS — R262 Difficulty in walking, not elsewhere classified: Secondary | ICD-10-CM | POA: Diagnosis not present

## 2020-02-16 DIAGNOSIS — M25662 Stiffness of left knee, not elsewhere classified: Secondary | ICD-10-CM | POA: Diagnosis not present

## 2020-02-20 DIAGNOSIS — R262 Difficulty in walking, not elsewhere classified: Secondary | ICD-10-CM | POA: Diagnosis not present

## 2020-02-20 DIAGNOSIS — R531 Weakness: Secondary | ICD-10-CM | POA: Diagnosis not present

## 2020-02-20 DIAGNOSIS — M25662 Stiffness of left knee, not elsewhere classified: Secondary | ICD-10-CM | POA: Diagnosis not present

## 2020-02-28 DIAGNOSIS — R262 Difficulty in walking, not elsewhere classified: Secondary | ICD-10-CM | POA: Diagnosis not present

## 2020-02-28 DIAGNOSIS — R531 Weakness: Secondary | ICD-10-CM | POA: Diagnosis not present

## 2020-02-28 DIAGNOSIS — M25662 Stiffness of left knee, not elsewhere classified: Secondary | ICD-10-CM | POA: Diagnosis not present

## 2020-03-07 DIAGNOSIS — M25662 Stiffness of left knee, not elsewhere classified: Secondary | ICD-10-CM | POA: Diagnosis not present

## 2020-03-07 DIAGNOSIS — R262 Difficulty in walking, not elsewhere classified: Secondary | ICD-10-CM | POA: Diagnosis not present

## 2020-03-07 DIAGNOSIS — R531 Weakness: Secondary | ICD-10-CM | POA: Diagnosis not present

## 2020-03-13 DIAGNOSIS — R531 Weakness: Secondary | ICD-10-CM | POA: Diagnosis not present

## 2020-03-13 DIAGNOSIS — M25662 Stiffness of left knee, not elsewhere classified: Secondary | ICD-10-CM | POA: Diagnosis not present

## 2020-03-13 DIAGNOSIS — R262 Difficulty in walking, not elsewhere classified: Secondary | ICD-10-CM | POA: Diagnosis not present

## 2020-03-15 DIAGNOSIS — R262 Difficulty in walking, not elsewhere classified: Secondary | ICD-10-CM | POA: Diagnosis not present

## 2020-03-15 DIAGNOSIS — M25662 Stiffness of left knee, not elsewhere classified: Secondary | ICD-10-CM | POA: Diagnosis not present

## 2020-03-15 DIAGNOSIS — R531 Weakness: Secondary | ICD-10-CM | POA: Diagnosis not present

## 2020-03-22 DIAGNOSIS — R262 Difficulty in walking, not elsewhere classified: Secondary | ICD-10-CM | POA: Diagnosis not present

## 2020-03-22 DIAGNOSIS — R531 Weakness: Secondary | ICD-10-CM | POA: Diagnosis not present

## 2020-03-22 DIAGNOSIS — M25662 Stiffness of left knee, not elsewhere classified: Secondary | ICD-10-CM | POA: Diagnosis not present

## 2020-03-27 DIAGNOSIS — R531 Weakness: Secondary | ICD-10-CM | POA: Diagnosis not present

## 2020-03-27 DIAGNOSIS — R262 Difficulty in walking, not elsewhere classified: Secondary | ICD-10-CM | POA: Diagnosis not present

## 2020-03-27 DIAGNOSIS — M25662 Stiffness of left knee, not elsewhere classified: Secondary | ICD-10-CM | POA: Diagnosis not present

## 2020-03-29 DIAGNOSIS — R262 Difficulty in walking, not elsewhere classified: Secondary | ICD-10-CM | POA: Diagnosis not present

## 2020-03-29 DIAGNOSIS — R531 Weakness: Secondary | ICD-10-CM | POA: Diagnosis not present

## 2020-04-19 DIAGNOSIS — R262 Difficulty in walking, not elsewhere classified: Secondary | ICD-10-CM | POA: Diagnosis not present

## 2020-04-19 DIAGNOSIS — R531 Weakness: Secondary | ICD-10-CM | POA: Diagnosis not present

## 2020-04-24 DIAGNOSIS — R262 Difficulty in walking, not elsewhere classified: Secondary | ICD-10-CM | POA: Diagnosis not present

## 2020-04-24 DIAGNOSIS — R531 Weakness: Secondary | ICD-10-CM | POA: Diagnosis not present

## 2020-05-01 DIAGNOSIS — R531 Weakness: Secondary | ICD-10-CM | POA: Diagnosis not present

## 2020-05-01 DIAGNOSIS — R262 Difficulty in walking, not elsewhere classified: Secondary | ICD-10-CM | POA: Diagnosis not present

## 2020-05-07 DIAGNOSIS — R531 Weakness: Secondary | ICD-10-CM | POA: Diagnosis not present

## 2020-05-07 DIAGNOSIS — R262 Difficulty in walking, not elsewhere classified: Secondary | ICD-10-CM | POA: Diagnosis not present

## 2020-05-15 DIAGNOSIS — R262 Difficulty in walking, not elsewhere classified: Secondary | ICD-10-CM | POA: Diagnosis not present

## 2020-05-15 DIAGNOSIS — R531 Weakness: Secondary | ICD-10-CM | POA: Diagnosis not present

## 2020-05-28 DIAGNOSIS — R262 Difficulty in walking, not elsewhere classified: Secondary | ICD-10-CM | POA: Diagnosis not present

## 2020-05-28 DIAGNOSIS — R531 Weakness: Secondary | ICD-10-CM | POA: Diagnosis not present

## 2020-06-08 DIAGNOSIS — Z03818 Encounter for observation for suspected exposure to other biological agents ruled out: Secondary | ICD-10-CM | POA: Diagnosis not present

## 2020-06-12 ENCOUNTER — Other Ambulatory Visit: Payer: Self-pay

## 2020-06-12 ENCOUNTER — Encounter: Payer: Self-pay | Admitting: Family Medicine

## 2020-06-12 ENCOUNTER — Telehealth (INDEPENDENT_AMBULATORY_CARE_PROVIDER_SITE_OTHER): Payer: Medicare HMO | Admitting: Family Medicine

## 2020-06-12 DIAGNOSIS — U071 COVID-19: Secondary | ICD-10-CM

## 2020-06-12 NOTE — Patient Instructions (Signed)

## 2020-06-12 NOTE — Progress Notes (Signed)
Virtual Visit Note  I connected with patient on 06/12/20 at 1438 by Epic video visit and verified that I am speaking with the correct person using two identifiers. Dale Mitchell is currently located at home and no family members are currently with them during visit. The provider, Laurita Quint Derrich Gaby, FNP is located in their office at time of visit.  I discussed the limitations, risks, security and privacy concerns of performing an evaluation and management service by telephone and the availability of in person appointments. I also discussed with the patient that there may be a patient responsible charge related to this service. The patient expressed understanding and agreed to proceed.   I provided 20 minutes of non-face-to-face time during this encounter.  Chief Complaint  Patient presents with  . recent positive covid     Dx on 06/08/2020 had a fever which broke last night, fatigue, a little congestion and cough  Has taken otc calcium, zinc, mucinex   . covid tx question    Pt states is on the tail end of covid and any symptoms, however would like to know if hydroxychloroquine would be a tx option     HPI  69 year old special needs daughter came down with covid She is now doing better  Symptoms started last tuesday? Covid positive on 06/08/20 Had a fever broke last night max 100.6 Now with fatigue, congestion and cough Has taken Calcium, Zinc, Mucinex  Wife still has a fever 99.x Did get Covid vaccination: March 29th, apr 15th Pfizer Influenza: did not get   No Known Allergies  Prior to Admission medications   Medication Sig Start Date End Date Taking? Authorizing Provider  amLODipine (NORVASC) 5 MG tablet Take 1 tablet (5 mg total) by mouth daily. Patient not taking: Reported on 06/12/2020 08/13/18   Forrest Moron, MD  oxyCODONE-acetaminophen (PERCOCET/ROXICET) 5-325 MG per tablet Take by mouth every 4 (four) hours as needed for severe pain. Patient not taking: Reported on  06/12/2020    [provider]    Past Medical History:  Diagnosis Date  . Colon polyp   . HLD (hyperlipidemia)   . HTN (hypertension)    no meds  . Murmur    no echo ever done  . Obesity     Past Surgical History:  Procedure Laterality Date  . COLONOSCOPY    . ORIF FINGER / [place]   right  . QUADRICEPS TENDON REPAIR Right 08/23/2014   Procedure: RIGHT QUADRICEP TENDON REPAIR;  Surgeon: Alta Corning, MD;  Location: [facility];  Service: Orthopedics;  Laterality: Right;  . WISDOM TOOTH EXTRACTION      Social History   Tobacco Use  . Smoking status: Former Smoker    Quit date: 06/30/1993    Years since quitting: 26.9  . Smokeless tobacco: Never Used  Substance Use Topics  . Alcohol use: Yes    Comment: rare    Family History  Problem Relation Age of Onset  . Heart failure Father   . Emphysema Father   . Cancer Mother     Review of Systems  Constitutional: Positive for fever and malaise/fatigue. Negative for chills and weight loss.  HENT: Positive for congestion. Negative for ear discharge, ear pain, hearing loss, sinus pain and sore throat.   Respiratory: Positive for cough. Negative for sputum production, shortness of breath, wheezing and stridor.   Cardiovascular: Negative for chest pain, palpitations and claudication.  Gastrointestinal: Negative for abdominal  pain, diarrhea, nausea and vomiting.  Neurological: Negative for headaches.    Objective  Constitutional:      General: She is not in acute distress.    Appearance: Normal appearance. She is not ill-appearing.   Pulmonary:     Effort: Pulmonary effort is normal. No respiratory distress.  Neurological:     Mental Status: She is alert and oriented to person, place, and time.  Psychiatric:        Mood and Affect: Mood normal.        Behavior: Behavior normal.     ASSESSMENT and PLAN  Problem List Items Addressed This Visit   None   Visit Diagnoses    COVID     -  Primary Discussed conservative management  RTC precautions provided. Discussed medication options and timeline.        Return if symptoms worsen or fail to improve.    The above assessment and management plan was discussed with the patient. The patient verbalized understanding of and has agreed to the management plan. Patient is aware to call the clinic if symptoms persist or worsen. Patient is aware when to return to the clinic for a follow-up visit. Patient educated on when it is appropriate to go to the emergency department.     Huston Foley Tomica Arseneault, FNP-BC Primary Care at [facility], [place] [address] Ph.  [phone] Fax [phone]

## 2020-07-09 ENCOUNTER — Other Ambulatory Visit: Payer: Self-pay

## 2020-07-09 ENCOUNTER — Ambulatory Visit (INDEPENDENT_AMBULATORY_CARE_PROVIDER_SITE_OTHER): Payer: Medicare HMO | Admitting: Family Medicine

## 2020-07-09 ENCOUNTER — Encounter: Payer: Self-pay | Admitting: Family Medicine

## 2020-07-09 VITALS — BP 163/99 | HR 67 | Temp 97.5°F | Ht 74.0 in | Wt 247.0 lb

## 2020-07-09 DIAGNOSIS — Z7185 Encounter for immunization safety counseling: Secondary | ICD-10-CM

## 2020-07-09 DIAGNOSIS — L237 Allergic contact dermatitis due to plants, except food: Secondary | ICD-10-CM

## 2020-07-09 DIAGNOSIS — I10 Essential (primary) hypertension: Secondary | ICD-10-CM

## 2020-07-09 NOTE — Patient Instructions (Addendum)
Goal BP  < 130/80  Poison Ivy Dermatitis Poison ivy dermatitis is redness and soreness of the skin caused by chemicals in the leaves of the poison ivy plant. You may have very bad itching, swelling, a rash, and blisters. What are the causes?  Touching a poison ivy plant.  Touching something that has the chemical on it. This may include animals or objects that have come in contact with the plant. What increases the risk?  Going outdoors often in wooded or [address] areas.  Going outdoors without wearing protective clothing, such as closed shoes, long pants, and a long-sleeved shirt. What are the signs or symptoms?  Skin redness.  Very bad itching.  A rash that often includes bumps and blisters. ? The rash usually appears 48 hours after exposure, if you have been exposed before. ? If this is the first time you have been exposed, the rash may not appear until a week after exposure.  Swelling. This may occur if the reaction is very bad. Symptoms usually last for 1-2 weeks. The first time you develop this condition, symptoms may last 3-4 weeks.   How is this treated? This condition may be treated with:  Hydrocortisone cream or calamine lotion to relieve itching.  Oatmeal baths to soothe the skin.  Medicines, such as over-the-counter antihistamine tablets.  Oral steroid medicine for more severe reactions. Follow these instructions at home: Medicines  Take or apply over-the-counter and prescription medicines only as told by your doctor.  Use hydrocortisone cream or calamine lotion as needed to help with itching. General instructions  Do not scratch or rub your skin.  Put a cold, wet cloth (cold compress) on the affected areas or take baths in cool water. This will help with itching.  Avoid hot baths and showers.  Take oatmeal baths as needed. Use colloidal oatmeal. You can get this at a pharmacy or grocery store. Follow the instructions on the package.  While you have the  rash, wash your clothes right after you wear them.  Keep all follow-up visits as told by your health care provider. This is important. How is this prevented?  Know what poison ivy looks like, so you can avoid it. ? This plant has three leaves with flowering branches on a single stem. ? The leaves are glossy. ? The leaves have uneven edges that come to a point at the front.  If you touch poison ivy, wash your skin with soap and water right away. Be sure to wash under your fingernails.  When hiking or camping, wear long pants, a long-sleeved shirt, tall socks, and hiking boots. You can also use a lotion on your skin that helps to prevent contact with poison ivy.  If you think that your clothes or outdoor gear came in contact with poison ivy, rinse them off with a garden hose before you bring them inside your house.  When doing yard work or gardening, wear gloves, long sleeves, long pants, and boots. Wash your garden tools and gloves if they come in contact with poison ivy.  If you think that your pet has come into contact with poison ivy, wash him or her with pet shampoo and water. Make sure to wear gloves while washing your pet.   Contact a doctor if:  You have open sores in the rash area.  You have more redness, swelling, or pain in the rash area.  You have redness that spreads beyond the rash area.  You have fluid, blood, or pus coming  from the rash area.  You have a fever.  You have a rash over a large area of your body.  You have a rash on your eyes, mouth, or genitals.  Your rash does not get better after a few weeks. Get help right away if:  Your face swells or your eyes swell shut.  You have trouble breathing.  You have trouble swallowing. These symptoms may be an emergency. Do not wait to see if the symptoms will go away. Get medical help right away. Call your local emergency services (911 in the [place] Do not drive yourself to the hospital. Summary  Poison ivy  dermatitis is redness and soreness of the skin caused by chemicals in the leaves of the poison ivy plant.  You may have skin redness, very bad itching, swelling, and a rash.  Do not scratch or rub your skin.  Take or apply over-the-counter and prescription medicines only as told by your doctor. This information is not intended to replace advice given to you by your health care provider. Make sure you discuss any questions you have with your health care provider. Document Revised: 10/08/2018 Document Reviewed: 06/11/2018 Elsevier Patient Education  2021 [employer].    If you have lab work done today you will be contacted with your lab results within the next 2 weeks.  If you have not heard from [place] then please contact us. The fastest way to get your results is to register for My Chart.   IF you received an x-ray today, you will receive an invoice from [facility]. Please contact [facility] at [phone] with questions or concerns regarding your invoice.   IF you received labwork today, you will receive an invoice from [facility]. Please contact [facility] at [phone] with questions or concerns regarding your invoice.   Our billing staff will not be able to assist you with questions regarding bills from these companies.  You will be contacted with the lab results as soon as they are available. The fastest way to get your results is to activate your My Chart account. Instructions are located on the last page of this paperwork. If you have not heard from [place] regarding the results in 2 weeks, please contact this office.

## 2020-07-09 NOTE — Progress Notes (Signed)
1/10/20228:54 AM  Dale Mitchell 08/14/1950, 70 y.o., male [id number]  Chief Complaint  Patient presents with  . exposure to poison ivy     X 1 week ago while doing yard work     HPI:   Patient is a 70 y.o. male with past medical history significant for HTN, HLD who presents today for poison ivy exposure.  Exposed to poison ivy last weekend Went to [facility] and used a OTC kit Seems to be better Not itchy now When it initially happened used Calmoseptine  Positive for covid Dec 7th By the 17th he was covid negative Wondering when to get booster Never   BP Readings from Last 3 Encounters:  07/09/20 (!) 163/99  07/27/19 (!) 144/82  12/01/18 (!) 144/82   Has not been taking amlodipine 59m Doesn't want to be on medications   [facility] 12/01/2018 08/13/2018  Decreased Interest 0 0 0  Down, Depressed, Hopeless 0 0 0  PHQ - 2 Score 0 0 0    Fall Risk  07/27/2019 12/01/2018 08/13/2018 04/25/2015  Falls in the past year? 0 0 0 Yes  Number falls in past yr: 0 0 0 1  Injury with Fall? 0 0 0 -  Follow up Falls evaluation completed;Education provided Falls evaluation completed - -     No Known Allergies  Prior to Admission medications   Medication Sig Start Date End Date Taking? Authorizing Provider  amLODipine (NORVASC) 5 MG tablet Take 1 tablet (5 mg total) by mouth daily. Patient not taking: No sig reported 08/13/18   SForrest Moron MD  oxyCODONE-acetaminophen (PERCOCET/ROXICET) 5-325 MG per tablet Take by mouth every 4 (four) hours as needed for severe pain. Patient not taking: No sig reported    [provider]    Past Medical History:  Diagnosis Date  . Colon polyp   . HLD (hyperlipidemia)   . HTN (hypertension)    no meds  . Murmur    no echo ever done  . Obesity     Past Surgical History:  Procedure Laterality Date  . COLONOSCOPY    [place]  [place]  . QUADRICEPS TENDON REPAIR Right 08/23/2014    Procedure: RIGHT QUADRICEP TENDON REPAIR;  Surgeon: JAlta Corning MD;  [place] [facility]  Service: Orthopedics;  Laterality: Right;  . WISDOM TOOTH EXTRACTION      Social History   Tobacco Use  . Smoking status: Former Smoker    Quit date: 06/30/1993    Years since quitting: 27.0  . Smokeless tobacco: Never Used  Substance Use Topics  . Alcohol use: Yes    Comment: rare    Family History  Problem Relation Age of Onset  . Heart failure Father   . Emphysema Father   . Cancer Mother     Review of Systems  Constitutional: Negative for chills, fever and malaise/fatigue.  Eyes: Negative for blurred vision and double vision.  Respiratory: Negative for cough, shortness of breath and wheezing.   Cardiovascular: Negative for chest pain, palpitations and leg swelling.  Gastrointestinal: Negative for abdominal pain, blood in stool, constipation, diarrhea, heartburn, nausea and vomiting.  Genitourinary: Negative for dysuria, frequency and hematuria.  Musculoskeletal: Negative for back pain and joint pain.  Skin: Positive for rash (Left inner arm).  Neurological: Negative for dizziness, weakness and headaches.     OBJECTIVE:  Today's Vitals   07/09/20 0829  BP: (!) 163/99  Pulse: 67  Temp: (!) 97.5 F (36.4 C)  SpO2: 95%  Weight: 247 lb (112 kg)  Height: 6' 2" (1.88 m)   Body mass index is 31.71 kg/m.   Physical Exam Vitals reviewed.  Constitutional:      Appearance: Normal appearance.  HENT:     Head: Normocephalic and atraumatic.  Eyes:     Conjunctiva/sclera: Conjunctivae normal.     Pupils: Pupils are equal, round, and reactive to light.  Cardiovascular:     Rate and Rhythm: Normal rate and regular rhythm.     Pulses: Normal pulses.     Heart sounds: Murmur heard.  No friction rub. No gallop.   Pulmonary:     Effort: Pulmonary effort is normal. No respiratory distress.     Breath sounds: Normal breath sounds. No stridor. No wheezing or  rales.  Abdominal:     General: Bowel sounds are normal.     Palpations: Abdomen is soft.     Tenderness: There is no abdominal tenderness.  Musculoskeletal:     Right lower leg: No edema.     Left lower leg: No edema.  Skin:    General: Skin is warm and dry.     Findings: Rash (Left inner arm) present.  Neurological:     General: No focal deficit present.     Mental Status: He is alert and oriented to person, place, and time.  Psychiatric:        Mood and Affect: Mood normal.        Behavior: Behavior normal.     No results found for this or any previous visit (from the past 24 hour(s)).  No results found.   ASSESSMENT and PLAN  Problem List Items Addressed This Visit   None   Visit Diagnoses    Poison ivy dermatitis    -  Primary   Essential hypertension       Immunization counseling        Plan  Discussed conservative treatment of poison IVY  Covid Vaccine counseling  Discussed risks of not taking BP medications  Encouraged to take BP at home daily for a goal of < 130/80   Return if symptoms worsen or fail to improve, for next scheduled appointment.    Huston Foley Jona Zappone, FNP-BC Primary Care at [facility] [place], [place] [address] Ph.  [phone] Fax [phone]

## 2020-09-18 ENCOUNTER — Encounter: Payer: Medicare HMO | Admitting: Family Medicine

## 2021-05-30 DIAGNOSIS — I10 Essential (primary) hypertension: Secondary | ICD-10-CM | POA: Diagnosis not present

## 2021-06-07 DIAGNOSIS — I10 Essential (primary) hypertension: Secondary | ICD-10-CM | POA: Diagnosis not present

## 2021-06-18 DIAGNOSIS — R69 Illness, unspecified: Secondary | ICD-10-CM | POA: Diagnosis not present

## 2021-07-22 DIAGNOSIS — Z0000 Encounter for general adult medical examination without abnormal findings: Secondary | ICD-10-CM | POA: Diagnosis not present

## 2021-07-22 DIAGNOSIS — Z125 Encounter for screening for malignant neoplasm of prostate: Secondary | ICD-10-CM | POA: Diagnosis not present

## 2021-07-22 DIAGNOSIS — I10 Essential (primary) hypertension: Secondary | ICD-10-CM | POA: Diagnosis not present

## 2021-07-22 DIAGNOSIS — Z136 Encounter for screening for cardiovascular disorders: Secondary | ICD-10-CM | POA: Diagnosis not present

## 2021-08-28 DIAGNOSIS — R079 Chest pain, unspecified: Secondary | ICD-10-CM | POA: Diagnosis not present

## 2021-08-29 ENCOUNTER — Encounter: Payer: Self-pay | Admitting: Cardiology

## 2021-08-29 ENCOUNTER — Other Ambulatory Visit: Payer: Self-pay

## 2021-08-29 ENCOUNTER — Ambulatory Visit: Payer: PPO | Admitting: Cardiology

## 2021-08-29 VITALS — BP 140/66 | HR 71 | Temp 97.8°F | Resp 17 | Ht 74.0 in | Wt 255.0 lb

## 2021-08-29 DIAGNOSIS — E782 Mixed hyperlipidemia: Secondary | ICD-10-CM

## 2021-08-29 DIAGNOSIS — I209 Angina pectoris, unspecified: Secondary | ICD-10-CM

## 2021-08-29 DIAGNOSIS — I10 Essential (primary) hypertension: Secondary | ICD-10-CM | POA: Diagnosis not present

## 2021-08-29 DIAGNOSIS — R072 Precordial pain: Secondary | ICD-10-CM | POA: Insufficient documentation

## 2021-08-29 MED ORDER — ASPIRIN EC 81 MG PO TBEC: 81.0000 mg | DELAYED_RELEASE_TABLET | Freq: Every day | ORAL | 3 refills | Status: DC

## 2021-08-29 MED ORDER — ISOSORBIDE MONONITRATE ER 30 MG PO TB24: 30.0000 mg | ORAL_TABLET | Freq: Every day | ORAL | 3 refills | Status: DC

## 2021-08-29 MED ORDER — ROSUVASTATIN CALCIUM 20 MG PO TABS: 20.0000 mg | ORAL_TABLET | Freq: Every day | ORAL | 3 refills | Status: DC

## 2021-08-29 NOTE — Progress Notes (Signed)
? ? ?Patient referred by Lonell Grandchild, NP [facility]ie Haber at [facility]) for chest pain ? ?Subjective:  ? ?Dale Mitchell, male    DOB: 23-Aug-1950, 71 y.o.   MRN: [id number] ? ? ?Chief Complaint  ?Patient presents with  ? New Patient (Initial Visit)  ? chest pain  ? ? ? ?HPI ? ?71 y.o. Caucasian male with hypertension, mixed hyperlipidemia, with chest pain ? ?Patient works as a Music therapist.  Prior to [place], he used to be regular with physical activity.  Since COVID, his physical activity has been limited yard work, family moved.  In the last few weeks, he has had multiple daily episodes of left-sided chest pain with physical exertion, such as holding mulch.  All of these episodes also occurred during driving.  Episodes seem to improve with rest, last for couple minutes.  He has had some associated exertional dyspnea, currently absent.  He denies any palpitations, orthopnea, PND, leg edema symptoms. ? ?He is a former smoker, quit in [place].  He is on hydrochlorothiazide 12.5 mg daily for hypertension.  Blood pressure readings are generally well controlled.  He is not on any lipid-lowering therapy at this time. ? ?Past Medical History:  ?Diagnosis Date  ? Colon polyp   ? HLD (hyperlipidemia)   ? HTN (hypertension)   ? no meds  ? Murmur   ? no echo ever done  ? Obesity   ? ? ? ?Past Surgical History:  ?Procedure Laterality Date  ? COLONOSCOPY    ? ORIF FINGER / THUMB FRACTURE  1995  ? right  ? QUADRICEPS TENDON REPAIR Right 08/23/2014  ? Procedure: RIGHT QUADRICEP TENDON REPAIR;  Surgeon: Alta Corning, MD;  Location: [facility];  Service: Orthopedics;  Laterality: Right;  ? WISDOM TOOTH EXTRACTION    ? ? ? ?Social History  ? ?Tobacco Use  ?Smoking Status Former  ? Types: Cigarettes  ? Quit date: 06/30/1993  ? Years since quitting: 28.1  ?Smokeless Tobacco Never  ? ? ?Social History  ? ?Substance and Sexual Activity  ?Alcohol Use Yes  ? Comment: rare  ? ? ? ?Family History  ?Problem Relation Age of Onset  ?  Heart failure Father   ? Emphysema Father   ? Cancer Mother   ? ? ? ? ?Current Outpatient Medications:  ?  hydrochlorothiazide (MICROZIDE) 12.5 MG capsule, Take 12.5 mg by mouth every morning., Disp: , Rfl:  ?  ibuprofen (ADVIL) 600 MG tablet, Take 600 mg by mouth every 6 (six) hours as needed., Disp: , Rfl:  ? ? ?Cardiovascular and other pertinent studies: ? ?Reviewed external labs and tests, independently interpreted ? ?EKG 08/28/2021: ?Sinus rhythm 70 bpm ?First degree AV block ?Low voltage limb leads ? ?Recent labs: ?07/22/2021: ?Glucose 97, BUN/Cr 22/0.91. EGFR 91. Na/K 140/4.5. Rest of the CMP normal ?Chol 223, TG 130, HDL 53, LDL 147 ? ? ?Review of Systems  ?Cardiovascular:  Positive for chest pain. Negative for dyspnea on exertion, leg swelling, palpitations and syncope.  ? ?   ? ? ?Vitals:  ? 08/29/21 1048 08/29/21 1059  ?BP: (!) 159/77 140/66  ?Pulse: 74 71  ?Resp: 17   ?Temp: 97.8 ?F (36.6 ?C)   ?SpO2: 97% 97%  ? ? ? ?Body mass index is 32.74 kg/m?. ?Filed Weights  ? 08/29/21 1048  ?Weight: 255 lb (115.7 kg)  ? ? ? ?Objective:  ? Physical Exam ?Vitals and nursing note reviewed.  ?Constitutional:   ?   General: He is  not in acute distress. ?Neck:  ?   Vascular: No JVD.  ?Cardiovascular:  ?   Rate and Rhythm: Normal rate and regular rhythm.  ?   Heart sounds: Normal heart sounds. No murmur heard. ?Pulmonary:  ?   Effort: Pulmonary effort is normal.  ?   Breath sounds: Normal breath sounds. No wheezing or rales.  ?Musculoskeletal:  ?   Right lower leg: No edema.  ?   Left lower leg: No edema.  ? ? ? ? ?   ? ?Visit diagnoses: ?  ICD-10-CM   ?1. Precordial pain  R07.2   ?  ?2. Mixed hyperlipidemia  E78.2 rosuvastatin (CRESTOR) 20 MG tablet  ?  ?3. Essential hypertension  I10   ?  ?4. Angina pectoris (HCC)  I20.9 aspirin EC 81 MG tablet  ?  isosorbide mononitrate (IMDUR) 30 MG 24 hr tablet  ?  PCV MYOCARDIAL PERFUSION WO LEXISCAN  ?  PCV ECHOCARDIOGRAM COMPLETE  ?  CT CARDIAC SCORING ([facility] LOCATIONS ONLY)  ?  ?   ? ?Orders Placed This Encounter  ?Procedures  ? CT CARDIAC SCORING ([facility] LOCATIONS ONLY)  ? PCV MYOCARDIAL PERFUSION WO LEXISCAN  ? PCV ECHOCARDIOGRAM COMPLETE  ?  ? ?  ?Meds ordered this encounter  ?Medications  ? aspirin EC 81 MG tablet  ?  Sig: Take 1 tablet (81 mg total) by mouth daily. Swallow whole.  ?  Dispense:  90 tablet  ?  Refill:  3  ? isosorbide mononitrate (IMDUR) 30 MG 24 hr tablet  ?  Sig: Take 1 tablet (30 mg total) by mouth daily.  ?  Dispense:  90 tablet  ?  Refill:  3  ? rosuvastatin (CRESTOR) 20 MG tablet  ?  Sig: Take 1 tablet (20 mg total) by mouth daily.  ?  Dispense:  30 tablet  ?  Refill:  3  ?  ? ?Assessment & Recommendations:  ? ? ?71 y.o. Caucasian male with hypertension, mixed hyperlipidemia, with chest pain ? ?Angina pectoris: ?Symptoms typical for angina.  Recommend aspirin 81 mg, Crestor 20 mg, Imdur 30 mg daily. ?Recommend exercise nuclear stress test, echo cardiogram, CT cardiac scoring. ?Blood pressure generally well controlled, no change made today to his baseline antihypertensive therapy of hydrochlorothiazide 12.5 mg daily. ? ?Further recommendations after above testing. ? ?Thank you for referring the patient to [place]. Please feel free to contact with any questions. ? ? ?Nigel Mormon, MD ?Pager: [phone] ?Office: [phone] ?

## 2021-09-06 ENCOUNTER — Ambulatory Visit: Payer: PPO

## 2021-09-06 ENCOUNTER — Other Ambulatory Visit: Payer: Self-pay

## 2021-09-06 DIAGNOSIS — I209 Angina pectoris, unspecified: Secondary | ICD-10-CM

## 2021-09-23 ENCOUNTER — Ambulatory Visit: Payer: PPO

## 2021-09-23 ENCOUNTER — Other Ambulatory Visit: Payer: Self-pay

## 2021-09-23 DIAGNOSIS — I209 Angina pectoris, unspecified: Secondary | ICD-10-CM

## 2021-10-04 ENCOUNTER — Ambulatory Visit
Admission: RE | Admit: 2021-10-04 | Discharge: 2021-10-04 | Disposition: A | Discharge status: Home / Self Care | Payer: No Typology Code available for payment source | Source: Ambulatory Visit | Attending: Cardiology | Admitting: Cardiology

## 2021-10-04 DIAGNOSIS — I209 Angina pectoris, unspecified: Secondary | ICD-10-CM

## 2021-10-05 DIAGNOSIS — I209 Angina pectoris, unspecified: Secondary | ICD-10-CM | POA: Insufficient documentation

## 2021-10-05 DIAGNOSIS — R931 Abnormal findings on diagnostic imaging of heart and coronary circulation: Secondary | ICD-10-CM | POA: Insufficient documentation

## 2021-10-05 NOTE — Progress Notes (Deleted)
? ? ?Patient referred by Lonell Grandchild, NP Sadie Haber at [facility]) for chest pain ? ?Subjective:  ? ?Dale Mitchell, male    DOB: 1951-03-26, 71 y.o.   MRN: [id number] ? ? ?No chief complaint on file. ? ? ? ?HPI ? ?71 y.o. Caucasian male with hypertension, mixed hyperlipidemia, with chest pain ? ?Patient works as a Music therapist.  Prior to [place], he used to be regular with physical activity.  Since COVID, his physical activity has been limited yard work, family moved.  In the last few weeks, he has had multiple daily episodes of left-sided chest pain with physical exertion, such as holding mulch.  All of these episodes also occurred during driving.  Episodes seem to improve with rest, last for couple minutes.  He has had some associated exertional dyspnea, currently absent.  He denies any palpitations, orthopnea, PND, leg edema symptoms. ? ?He is a former smoker, quit in [place].  He is on hydrochlorothiazide 12.5 mg daily for hypertension.  Blood pressure readings are generally well controlled.  He is not on any lipid-lowering therapy at this time. ? ? ? ?Current Outpatient Medications:  ?  aspirin EC 81 MG tablet, Take 1 tablet (81 mg total) by mouth daily. Swallow whole., Disp: 90 tablet, Rfl: 3 ?  hydrochlorothiazide (MICROZIDE) 12.5 MG capsule, Take 12.5 mg by mouth every morning., Disp: , Rfl:  ?  ibuprofen (ADVIL) 600 MG tablet, Take 600 mg by mouth every 6 (six) hours as needed., Disp: , Rfl:  ?  isosorbide mononitrate (IMDUR) 30 MG 24 hr tablet, Take 1 tablet (30 mg total) by mouth daily., Disp: 90 tablet, Rfl: 3 ?  rosuvastatin (CRESTOR) 20 MG tablet, Take 1 tablet (20 mg total) by mouth daily., Disp: 30 tablet, Rfl: 3 ? ? ?Cardiovascular and other pertinent studies: ? ?Reviewed external labs and tests, independently interpreted ? ?CT cardiac scoring 10/04/2021: ?Left Main: 4  ?LAD: 684  ?LCx: 231  ?RCA: 193 ?  ?Total Agatston Score: 1161 ?MESA database percentile: 82% ?  ?AORTA MEASUREMENTS: ?Ascending  Aorta: 38 mm ?Descending Aorta: 29 mm ? ?Exercise Tetrofosmin stress test 09/23/2021: ?Exercise nuclear stress test was performed using Bruce protocol. Patient reached 8 METS, and 87% of age predicted maximum heart rate. Exercise capacity was low. No chest pain reported. Normal heart rate response. Resting hypertension SBP 162 mmHg, hypertensive response with peak BP 204/60 mmHg. Stress EKG revealed no ischemic changes. ?Normal myocardial perfusion. Stress LVEF 86%. ?Low risk study. ? ?Echocardiogram 09/06/2021:  ?Normal LV systolic function with visual EF 60-65%. Left ventricle cavity  ?is normal in size. No obvious regional wall motion abnormalities. Normal  ?left ventricular wall thickness. Normal global wall motion. Normal  ?diastolic filling pattern, normal LAP.  ?Left atrial cavity is mildly dilated.  ?Mild tricuspid regurgitation. No evidence of pulmonary hypertension. RVSP  ?measures 30 mmHg.  ?Mild pulmonic regurgitation.  ?No prior study for comparison. ? ?EKG 08/28/2021: ?Sinus rhythm 70 bpm ?First degree AV block ?Low voltage limb leads ? ?Recent labs: ?07/22/2021: ?Glucose 97, BUN/Cr 22/0.91. EGFR 91. Na/K 140/4.5. Rest of the CMP normal ?Chol 223, TG 130, HDL 53, LDL 147 ? ? ?Review of Systems  ?Cardiovascular:  Positive for chest pain. Negative for dyspnea on exertion, leg swelling, palpitations and syncope.  ? ?   ? ? ?There were no vitals filed for this visit. ? ? ? ?There is no height or weight on file to calculate BMI. ?There were no vitals filed for this visit. ? ? ? ?  Objective:  ? Physical Exam ?Vitals and nursing note reviewed.  ?Constitutional:   ?   General: He is not in acute distress. ?Neck:  ?   Vascular: No JVD.  ?Cardiovascular:  ?   Rate and Rhythm: Normal rate and regular rhythm.  ?   Heart sounds: Normal heart sounds. No murmur heard. ?Pulmonary:  ?   Effort: Pulmonary effort is normal.  ?   Breath sounds: Normal breath sounds. No wheezing or rales.  ?Musculoskeletal:  ?   Right lower leg:  No edema.  ?   Left lower leg: No edema.  ? ? ? ? ?   ? ?Visit diagnoses: ?  ICD-10-CM   ?1. Angina pectoris (HCC)  I20.9   ?  ?2. Elevated coronary artery calcium score  R93.1   ?  ?3. Mixed hyperlipidemia  E78.2   ?  ?4. Essential hypertension  I10   ?  ?  ? ?No orders of the defined types were placed in this encounter. ?  ? ?  ?No orders of the defined types were placed in this encounter. ?  ? ?Assessment & Recommendations:  ? ? ?71 y.o. Caucasian male with hypertension, mixed hyperlipidemia, with chest pain ? ?Angina pectoris: ?Symptoms typical for angina.  Recommend aspirin 81 mg, Crestor 20 mg, Imdur 30 mg daily. ?Recommend exercise nuclear stress test, echo cardiogram, CT cardiac scoring. ?Blood pressure generally well controlled, no change made today to his baseline antihypertensive therapy of hydrochlorothiazide 12.5 mg daily. ? ?Further recommendations after above testing. ? ?Thank you for referring the patient to us. Please feel free to contact with any questions. ? ? ?Manish J Patwardhan, MD ?Pager: [phone] ?Office: [phone] ?

## 2021-10-07 ENCOUNTER — Ambulatory Visit: Payer: PPO | Admitting: Cardiology

## 2021-10-07 DIAGNOSIS — I10 Essential (primary) hypertension: Secondary | ICD-10-CM

## 2021-10-07 DIAGNOSIS — R931 Abnormal findings on diagnostic imaging of heart and coronary circulation: Secondary | ICD-10-CM

## 2021-10-07 DIAGNOSIS — E782 Mixed hyperlipidemia: Secondary | ICD-10-CM

## 2021-10-07 DIAGNOSIS — I209 Angina pectoris, unspecified: Secondary | ICD-10-CM

## 2021-11-14 NOTE — Progress Notes (Signed)
Patient referred by Lonell Grandchild, NP Sadie Haber at [facility]) for chest pain  Subjective:   Dale Mitchell, male    DOB: 06/24/1951, 71 y.o.   MRN: [id number]   Chief Complaint  Patient presents with   Follow-up    4 week   Chest Pain   Results    HPI  71 y.o. Caucasian male with hypertension, mixed hyperlipidemia, with chest pain  Patient has not had any chest pain episodes since last visit. Reviewed recent test results with the patient, details below.    Initial consultation visit 08/2021: Patient works as a Music therapist.  Prior to [place], he used to be regular with physical activity.  Since COVID, his physical activity has been limited yard work, family moved.  In the last few weeks, he has had multiple daily episodes of left-sided chest pain with physical exertion, such as holding mulch.  All of these episodes also occurred during driving.  Episodes seem to improve with rest, last for couple minutes.  He has had some associated exertional dyspnea, currently absent.  He denies any palpitations, orthopnea, PND, leg edema symptoms.  He is a former smoker, quit in [place].  He is on hydrochlorothiazide 12.5 mg daily for hypertension.  Blood pressure readings are generally well controlled.  He is not on any lipid-lowering therapy at this time.  Current Outpatient Medications:    isosorbide mononitrate (IMDUR) 30 MG 24 hr tablet, Take 1 tablet (30 mg total) by mouth daily., Disp: 90 tablet, Rfl: 3   lisinopril-hydrochlorothiazide (ZESTORETIC) 20-12.5 MG tablet, Take 1 tablet by mouth daily., Disp: , Rfl:    rosuvastatin (CRESTOR) 20 MG tablet, Take 1 tablet (20 mg total) by mouth daily., Disp: 30 tablet, Rfl: 3   Cardiovascular and other pertinent studies:  Reviewed external labs and tests, independently interpreted  CT cardiac scoring 10/04/2021: LM: 29 LAD: 684 LCx: 231 RCA: 247   Total Agatston Score: 1161 MESA database percentile: 82%  Exercise Tetrofosmin stress test  09/23/2021: Exercise nuclear stress test was performed using Bruce protocol. Patient reached 8 METS, and 87% of age predicted maximum heart rate. Exercise capacity was low. No chest pain reported. Normal heart rate response. Resting hypertension SBP 162 mmHg, hypertensive response with peak BP 204/60 mmHg. Stress EKG revealed no ischemic changes. Normal myocardial perfusion. Stress LVEF 86%. Low risk study.   Echocardiogram 09/06/2021:  Normal LV systolic function with visual EF 60-65%. Left ventricle cavity  is normal in size. No obvious regional wall motion abnormalities. Normal  left ventricular wall thickness. Normal global wall motion. Normal  diastolic filling pattern, normal LAP.  Left atrial cavity is mildly dilated.  Mild tricuspid regurgitation. No evidence of pulmonary hypertension. RVSP  measures 30 mmHg.  Mild pulmonic regurgitation.  No prior study for comparison.    EKG 08/28/2021: Sinus rhythm 70 bpm First degree AV block Low voltage limb leads  Recent labs: 07/22/2021: Glucose 97, BUN/Cr 22/0.91. EGFR 91. Na/K 140/4.5. Rest of the CMP normal Chol 223, TG 130, HDL 53, LDL 147   Review of Systems  Cardiovascular:  Positive for chest pain. Negative for dyspnea on exertion, leg swelling, palpitations and syncope.        Vitals:   11/15/21 0828  BP: 130/68  Pulse: 67  Resp: 17  Temp: 98 F (36.7 C)  SpO2: 96%     Body mass index is 33 kg/m. Filed Weights   11/15/21 0828  Weight: 257 lb (116.6 kg)  Objective:   Physical Exam Vitals and nursing note reviewed.  Constitutional:      General: He is not in acute distress. Neck:     Vascular: No JVD.  Cardiovascular:     Rate and Rhythm: Normal rate and regular rhythm.     Heart sounds: Normal heart sounds. No murmur heard. Pulmonary:     Effort: Pulmonary effort is normal.     Breath sounds: Normal breath sounds. No wheezing or rales.  Musculoskeletal:     Right lower leg: No edema.     Left  lower leg: No edema.          Visit diagnoses:   ICD-10-CM   1. Angina pectoris (HCC)  I20.9 isosorbide mononitrate (IMDUR) 30 MG 24 hr tablet    Lipid panel    Lipid panel    2. Mixed hyperlipidemia  E78.2 Lipid panel    Lipid panel    DISCONTINUED: rosuvastatin (CRESTOR) 40 MG tablet    3. Essential hypertension  I10     4. Agatston coronary artery calcium score greater than 400  R93.1        Orders Placed This Encounter  Procedures   Lipid panel     Assessment & Recommendations:    71 y.o. Caucasian male with hypertension, mixed hyperlipidemia, with chest pain  Angina pectoris: Multivessel 92nd percentile calcium score (09/2021), without ischemia on stress testing (08/2021) Symptoms controlled on Imdur. Recommend aspirin 81 mg, Crestor 40 mg, Imdur 30 mg daily. Repeat lipid panel in 3 months  Okay to exercise, but recommended graded and cautious approach. Keep SL NTG handy at all times.   Hypertension: Controlled  Mixed hyperlipidemia: As above  F/u in 3 months   Thank you for referring the patient to [place]. Please feel free to contact with any questions.   Nigel Mormon, MD Pager: [phone] Office: [phone]

## 2021-11-15 ENCOUNTER — Encounter: Payer: Self-pay | Admitting: Cardiology

## 2021-11-15 ENCOUNTER — Ambulatory Visit: Payer: PPO | Admitting: Cardiology

## 2021-11-15 VITALS — BP 130/68 | HR 67 | Temp 98.0°F | Resp 17 | Ht 74.0 in | Wt 257.0 lb

## 2021-11-15 DIAGNOSIS — E782 Mixed hyperlipidemia: Secondary | ICD-10-CM

## 2021-11-15 DIAGNOSIS — I209 Angina pectoris, unspecified: Secondary | ICD-10-CM | POA: Diagnosis not present

## 2021-11-15 DIAGNOSIS — R931 Abnormal findings on diagnostic imaging of heart and coronary circulation: Secondary | ICD-10-CM

## 2021-11-15 DIAGNOSIS — I10 Essential (primary) hypertension: Secondary | ICD-10-CM | POA: Diagnosis not present

## 2021-11-15 MED ORDER — ISOSORBIDE MONONITRATE ER 30 MG PO TB24: 30.0000 mg | ORAL_TABLET | Freq: Every day | ORAL | 3 refills | Status: DC

## 2021-11-15 MED ORDER — ASPIRIN 81 MG PO TBEC: 81.0000 mg | DELAYED_RELEASE_TABLET | Freq: Every day | ORAL | 3 refills | Status: DC

## 2021-11-15 MED ORDER — NITROGLYCERIN 0.4 MG SL SUBL: 0.4000 mg | SUBLINGUAL_TABLET | SUBLINGUAL | 3 refills | Status: DC | PRN

## 2021-11-15 MED ORDER — ROSUVASTATIN CALCIUM 40 MG PO TABS: 20.0000 mg | ORAL_TABLET | Freq: Every day | ORAL | 3 refills | Status: DC

## 2021-11-15 MED ORDER — ROSUVASTATIN CALCIUM 40 MG PO TABS: 40.0000 mg | ORAL_TABLET | Freq: Every day | ORAL | 3 refills | Status: DC

## 2021-11-16 ENCOUNTER — Other Ambulatory Visit: Payer: Self-pay | Admitting: Cardiology

## 2021-11-16 DIAGNOSIS — E782 Mixed hyperlipidemia: Secondary | ICD-10-CM

## 2021-12-08 DIAGNOSIS — H6123 Impacted cerumen, bilateral: Secondary | ICD-10-CM | POA: Diagnosis not present

## 2021-12-16 ENCOUNTER — Other Ambulatory Visit: Payer: Self-pay | Admitting: Cardiology

## 2021-12-16 DIAGNOSIS — E782 Mixed hyperlipidemia: Secondary | ICD-10-CM

## 2022-02-21 ENCOUNTER — Ambulatory Visit: Payer: PPO | Admitting: Cardiology

## 2022-02-21 ENCOUNTER — Encounter: Payer: Self-pay | Admitting: Cardiology

## 2022-02-21 VITALS — BP 149/71 | HR 57 | Temp 97.2°F | Resp 14 | Ht 74.0 in | Wt 258.8 lb

## 2022-02-21 DIAGNOSIS — E782 Mixed hyperlipidemia: Secondary | ICD-10-CM

## 2022-02-21 DIAGNOSIS — I209 Angina pectoris, unspecified: Secondary | ICD-10-CM | POA: Diagnosis not present

## 2022-02-21 DIAGNOSIS — I10 Essential (primary) hypertension: Secondary | ICD-10-CM

## 2022-02-21 NOTE — Progress Notes (Signed)
Patient referred by Lonell Grandchild, NP Sadie Haber at [facility]) for chest pain  Subjective:   Dale Mitchell, male    DOB: 1950-12-13, 71 y.o.   MRN: [id number]   Chief Complaint  Patient presents with   Coronary Artery Disease    3 months    HPI  71 y.o. Caucasian male with hypertension, mixed hyperlipidemia, with chest pain  Patient is doing well. He has had only occasional chest pain episode. He is staying active with yard work etc. Blood pressure is elevated today, higher than usual.    Initial consultation visit 08/2021: Patient works as a Music therapist.  Prior to [place], he used to be regular with physical activity.  Since COVID, his physical activity has been limited yard work, family moved.  In the last few weeks, he has had multiple daily episodes of left-sided chest pain with physical exertion, such as holding mulch.  All of these episodes also occurred during driving.  Episodes seem to improve with rest, last for couple minutes.  He has had some associated exertional dyspnea, currently absent.  He denies any palpitations, orthopnea, PND, leg edema symptoms.  He is a former smoker, quit in [place].  He is on hydrochlorothiazide 12.5 mg daily for hypertension.  Blood pressure readings are generally well controlled.  He is not on any lipid-lowering therapy at this time.  Current Outpatient Medications:    aspirin EC 81 MG tablet, Take 1 tablet (81 mg total) by mouth daily. Swallow whole., Disp: 90 tablet, Rfl: 3   isosorbide mononitrate (IMDUR) 30 MG 24 hr tablet, Take 1 tablet (30 mg total) by mouth daily., Disp: 90 tablet, Rfl: 3   lisinopril-hydrochlorothiazide (ZESTORETIC) 20-12.5 MG tablet, Take 1 tablet by mouth daily., Disp: , Rfl:    nitroGLYCERIN (NITROSTAT) 0.4 MG SL tablet, Place 1 tablet (0.4 mg total) under the tongue every 5 (five) minutes as needed for chest pain., Disp: 30 tablet, Rfl: 3   rosuvastatin (CRESTOR) 40 MG tablet, Take 1 tablet (40 mg total) by mouth  daily., Disp: 90 tablet, Rfl: 3   Cardiovascular and other pertinent studies:  Reviewed external labs and tests, independently interpreted  CT cardiac scoring 10/04/2021: LM: 29 LAD: 684 LCx: 231 RCA: 247   Total Agatston Score: 1161 MESA database percentile: 82%  Exercise Tetrofosmin stress test 09/23/2021: Exercise nuclear stress test was performed using Bruce protocol. Patient reached 8 METS, and 87% of age predicted maximum heart rate. Exercise capacity was low. No chest pain reported. Normal heart rate response. Resting hypertension SBP 162 mmHg, hypertensive response with peak BP 204/60 mmHg. Stress EKG revealed no ischemic changes. Normal myocardial perfusion. Stress LVEF 86%. Low risk study.   Echocardiogram 09/06/2021:  Normal LV systolic function with visual EF 60-65%. Left ventricle cavity  is normal in size. No obvious regional wall motion abnormalities. Normal  left ventricular wall thickness. Normal global wall motion. Normal  diastolic filling pattern, normal LAP.  Left atrial cavity is mildly dilated.  Mild tricuspid regurgitation. No evidence of pulmonary hypertension. RVSP  measures 30 mmHg.  Mild pulmonic regurgitation.  No prior study for comparison.    EKG 08/28/2021: Sinus rhythm 70 bpm First degree AV block Low voltage limb leads  Recent labs: 07/22/2021: Glucose 97, BUN/Cr 22/0.91. EGFR 91. Na/K 140/4.5. Rest of the CMP normal Chol 223, TG 130, HDL 53, LDL 147   Review of Systems  Cardiovascular:  Positive for chest pain. Negative for dyspnea on exertion, leg swelling, palpitations and  syncope.         Vitals:   02/21/22 1222  BP: (!) 149/71  Pulse: (!) 57  Resp: 14  Temp: (!) 97.2 F (36.2 C)  SpO2: 96%     Body mass index is 33.23 kg/m. Filed Weights   02/21/22 1222  Weight: 258 lb 12.8 oz (117.4 kg)     Objective:   Physical Exam Vitals and nursing note reviewed.  Constitutional:      General: He is not in acute  distress. Neck:     Vascular: No JVD.  Cardiovascular:     Rate and Rhythm: Normal rate and regular rhythm.     Heart sounds: Normal heart sounds. No murmur heard. Pulmonary:     Effort: Pulmonary effort is normal.     Breath sounds: Normal breath sounds. No wheezing or rales.  Musculoskeletal:     Right lower leg: No edema.     Left lower leg: No edema.           Visit diagnoses:   ICD-10-CM   1. Mixed hyperlipidemia  E78.2 Lipid panel    2. Angina pectoris (HCC)  I20.9     3. Essential hypertension  I10        Orders Placed This Encounter  Procedures   Lipid panel     Assessment & Recommendations:    71 y.o. Caucasian male with hypertension, mixed hyperlipidemia, with chest pain  Angina pectoris: Multivessel 92nd percentile calcium score (09/2021), without ischemia on stress testing (08/2021) Symptoms controlled on Imdur. Not on beta blocker due to resting bradycardia. Recommend aspirin 81 mg, Crestor 40 mg, Imdur 30 mg daily. Check lipid panel.  Hypertension: BP elevated. Recommend regular home checks.  Mixed hyperlipidemia: As above  F/u in 4 weeks to re-assess blood pressure.    Nigel Mormon, MD Pager: [phone] Office: [phone]

## 2022-02-26 ENCOUNTER — Telehealth: Payer: Self-pay | Admitting: Cardiology

## 2022-02-26 NOTE — Telephone Encounter (Signed)
Patient says he went to [facility] for refills and they gave him a bottle of aspirin. He said he was needing 4 medications refilled. Can you look into this and call him? Thank you.

## 2022-02-28 ENCOUNTER — Other Ambulatory Visit: Payer: Self-pay

## 2022-02-28 DIAGNOSIS — I209 Angina pectoris, unspecified: Secondary | ICD-10-CM

## 2022-02-28 MED ORDER — NITROGLYCERIN 0.4 MG SL SUBL: 0.4000 mg | SUBLINGUAL_TABLET | SUBLINGUAL | 3 refills | Status: DC | PRN

## 2022-02-28 MED ORDER — LISINOPRIL-HYDROCHLOROTHIAZIDE 20-12.5 MG PO TABS: 1.0000 | ORAL_TABLET | Freq: Every day | ORAL | 3 refills | Status: DC

## 2022-02-28 MED ORDER — ROSUVASTATIN CALCIUM 40 MG PO TABS: 40.0000 mg | ORAL_TABLET | Freq: Every day | ORAL | 3 refills | Status: DC

## 2022-02-28 MED ORDER — ISOSORBIDE MONONITRATE ER 30 MG PO TB24: 30.0000 mg | ORAL_TABLET | Freq: Every day | ORAL | 3 refills | Status: DC

## 2022-02-28 NOTE — Telephone Encounter (Signed)
Done they have been sent to pharmacy

## 2022-03-19 DIAGNOSIS — E782 Mixed hyperlipidemia: Secondary | ICD-10-CM | POA: Diagnosis not present

## 2022-03-20 LAB — LIPID PANEL
Chol/HDL Ratio: 2.4 ratio (ref 0.0–5.0)
Cholesterol, Total: 126 mg/dL (ref 100–199)
HDL: 53 mg/dL (ref >39)
LDL Chol Calc (NIH): 55 mg/dL (ref 0–99)
Triglycerides: 95 mg/dL (ref 0–149)
VLDL Cholesterol Cal: 18 mg/dL (ref 5–40)

## 2022-03-24 ENCOUNTER — Ambulatory Visit: Payer: PPO | Admitting: Cardiology

## 2022-03-24 ENCOUNTER — Encounter: Payer: Self-pay | Admitting: Cardiology

## 2022-03-24 VITALS — BP 158/69 | HR 64 | Temp 98.0°F | Resp 16 | Ht 74.0 in | Wt 260.0 lb

## 2022-03-24 DIAGNOSIS — E782 Mixed hyperlipidemia: Secondary | ICD-10-CM | POA: Diagnosis not present

## 2022-03-24 DIAGNOSIS — I209 Angina pectoris, unspecified: Secondary | ICD-10-CM

## 2022-03-24 DIAGNOSIS — I10 Essential (primary) hypertension: Secondary | ICD-10-CM

## 2022-03-24 MED ORDER — VALSARTAN-HYDROCHLOROTHIAZIDE 160-12.5 MG PO TABS: 1.0000 | ORAL_TABLET | Freq: Every day | ORAL | 2 refills | Status: DC

## 2022-03-24 NOTE — Progress Notes (Signed)
Patient referred by Lonell Grandchild, NP Sadie Haber at [facility]) for chest pain  Subjective:   Dale Mitchell, male    DOB: Jan 18, 1951, 71 y.o.   MRN: [id number]   Chief Complaint  Patient presents with   Hypertension   Hyperlipidemia   Follow-up    70-4 week Ct score result    HPI  71 y.o. Caucasian male with hypertension, mixed hyperlipidemia, with chest pain  Patient continues to have episodes of chest pain. Surprisingly, these episodes do not occur with exertion. They seemed to occur at rest, resolve within a min on their own. Reviewed recent test results with the patient, details below. Blood pressure remains elevated.   Initial consultation visit 70/2023: Patient works as a Music therapist.  Prior to [place], he used to be regular with physical activity.  Since COVID, his physical activity has been limited yard work, family moved.  In the last few weeks, he has had multiple daily episodes of left-sided chest pain with physical exertion, such as holding mulch.  All of these episodes also occurred during driving.  Episodes seem to improve with rest, last for couple minutes.  He has had some associated exertional dyspnea, currently absent.  He denies any palpitations, orthopnea, PND, leg edema symptoms.  He is a former smoker, quit in [place].  He is on hydrochlorothiazide 12.5 mg daily for hypertension.  Blood pressure readings are generally well controlled.  He is not on any lipid-lowering therapy at this time.  Current Outpatient Medications:    aspirin EC 81 MG tablet, Take 1 tablet (81 mg total) by mouth daily. Swallow whole., Disp: 90 tablet, Rfl: 3   isosorbide mononitrate (IMDUR) 30 MG 24 hr tablet, Take 1 tablet (30 mg total) by mouth daily., Disp: 90 tablet, Rfl: 3   lisinopril-hydrochlorothiazide (ZESTORETIC) 20-12.5 MG tablet, Take 1 tablet by mouth daily., Disp: 90 tablet, Rfl: 3   nitroGLYCERIN (NITROSTAT) 0.4 MG SL tablet, Place 1 tablet (0.4 mg total) under the tongue  every 5 (five) minutes as needed for chest pain., Disp: 30 tablet, Rfl: 3   rosuvastatin (CRESTOR) 40 MG tablet, Take 1 tablet (40 mg total) by mouth daily., Disp: 90 tablet, Rfl: 3   Cardiovascular and other pertinent studies:  Reviewed external labs and tests, independently interpreted  CT cardiac scoring 10/04/2021: LM: 29 LAD: 684 LCx: 231 RCA: 247   Total Agatston Score: 1161 MESA database percentile: 82%  Exercise Tetrofosmin stress test 09/23/2021: Exercise nuclear stress test was performed using Bruce protocol. Patient reached 8 METS, and 87% of age predicted maximum heart rate. Exercise capacity was low. No chest pain reported. Normal heart rate response. Resting hypertension SBP 162 mmHg, hypertensive response with peak BP 204/60 mmHg. Stress EKG revealed no ischemic changes. Normal myocardial perfusion. Stress LVEF 86%. Low risk study.   Echocardiogram 09/06/2021:  Normal LV systolic function with visual EF 60-65%. Left ventricle cavity  is normal in size. No obvious regional wall motion abnormalities. Normal  left ventricular wall thickness. Normal global wall motion. Normal  diastolic filling pattern, normal LAP.  Left atrial cavity is mildly dilated.  Mild tricuspid regurgitation. No evidence of pulmonary hypertension. RVSP  measures 30 mmHg.  Mild pulmonic regurgitation.  No prior study for comparison.    EKG 08/28/2021: Sinus rhythm 70 bpm First degree AV block Low voltage limb leads  Recent labs: 03/19/2022: Chol 126, TG 95, HDL 53, LDL 55  07/22/2021: Glucose 97, BUN/Cr 22/0.91. EGFR 91. Na/K 140/4.5. Rest of the  CMP normal Chol 223, TG 130, HDL 53, LDL 147   Review of Systems  Cardiovascular:  Positive for chest pain. Negative for dyspnea on exertion, leg swelling, palpitations and syncope.         Vitals:   03/24/22 1351  BP: (!) 158/69  Pulse: 64  Resp: 16  Temp: 98 F (36.7 C)  SpO2: 96%     Body mass index is 33.38 kg/m. Filed Weights    03/24/22 1351  Weight: 260 lb (117.9 kg)     Objective:   Physical Exam Vitals and nursing note reviewed.  Constitutional:      General: He is not in acute distress. Neck:     Vascular: No JVD.  Cardiovascular:     Rate and Rhythm: Normal rate and regular rhythm.     Heart sounds: Normal heart sounds. No murmur heard. Pulmonary:     Effort: Pulmonary effort is normal.     Breath sounds: Normal breath sounds. No wheezing or rales.  Musculoskeletal:     Right lower leg: No edema.     Left lower leg: No edema.           Visit diagnoses:   ICD-10-CM   1. Angina pectoris (HCC)  I20.9     2. Essential hypertension  I10 valsartan-hydrochlorothiazide (DIOVAN HCT) 160-12.5 MG tablet    Basic metabolic panel    3. Mixed hyperlipidemia  E78.2        Orders Placed This Encounter  Procedures   Basic metabolic panel     Assessment & Recommendations:   71 y.o. Caucasian male with hypertension, mixed hyperlipidemia, with chest pain  Angina pectoris: Multivessel 92nd percentile calcium score (09/2021), without ischemia on stress testing (08/2021) Symptoms controlled on Imdur. Not on beta blocker due to resting bradycardia. Recent chest pain at rest, not with exertion are atypical for angina. Monitor for now on medical therapy. If he has more symptoms on exertion, can consider coronary anatomy evaluation. Continue Aspirin 81 mg, Crestor 40 mg, Imdur 30 mg daily. LDL down from 147 to 55.  Hypertension: Uncontrolled. Take 2 pills of lisinopril-HCTZ 20-12.5 mg daily. When he runs out, he will switch to valsartan-HCTZ 160-12.5 mg daily. Check BMP in 2-3 weeks  Mixed hyperlipidemia: As above  F/u in 4 weeks    Nigel Mormon, MD Pager: [phone] Office: [phone]

## 2022-05-01 ENCOUNTER — Ambulatory Visit: Payer: PPO | Admitting: Cardiology

## 2022-05-20 DIAGNOSIS — H5213 Myopia, bilateral: Secondary | ICD-10-CM | POA: Diagnosis not present

## 2022-05-27 DIAGNOSIS — I10 Essential (primary) hypertension: Secondary | ICD-10-CM | POA: Diagnosis not present

## 2022-05-28 LAB — BASIC METABOLIC PANEL
BUN/Creatinine Ratio: 18 (ref 10–24)
BUN: 18 mg/dL (ref 8–27)
CO2: 27 mmol/L (ref 20–29)
Calcium: 9.3 mg/dL (ref 8.6–10.2)
Chloride: 101 mmol/L (ref 96–106)
Creatinine, Ser: 0.98 mg/dL (ref 0.76–1.27)
Glucose: 92 mg/dL (ref 70–99)
Potassium: 3.9 mmol/L (ref 3.5–5.2)
Sodium: 141 mmol/L (ref 134–144)
eGFR: 83 mL/min/{1.73_m2} (ref >59)

## 2022-05-30 ENCOUNTER — Ambulatory Visit: Payer: PPO | Admitting: Cardiology

## 2022-05-30 ENCOUNTER — Encounter: Payer: Self-pay | Admitting: Cardiology

## 2022-05-30 VITALS — BP 137/62 | HR 68 | Ht 74.0 in | Wt 256.4 lb

## 2022-05-30 DIAGNOSIS — I209 Angina pectoris, unspecified: Secondary | ICD-10-CM

## 2022-05-30 DIAGNOSIS — E782 Mixed hyperlipidemia: Secondary | ICD-10-CM | POA: Diagnosis not present

## 2022-05-30 DIAGNOSIS — I10 Essential (primary) hypertension: Secondary | ICD-10-CM | POA: Diagnosis not present

## 2022-05-30 MED ORDER — ASPIRIN 81 MG PO TBEC: 81.0000 mg | DELAYED_RELEASE_TABLET | Freq: Every day | ORAL | 3 refills | Status: DC

## 2022-05-30 MED ORDER — NITROGLYCERIN 0.4 MG SL SUBL: 0.4000 mg | SUBLINGUAL_TABLET | SUBLINGUAL | 3 refills | Status: DC | PRN

## 2022-05-30 MED ORDER — VALSARTAN-HYDROCHLOROTHIAZIDE 160-12.5 MG PO TABS: 1.0000 | ORAL_TABLET | Freq: Every day | ORAL | 3 refills | Status: DC

## 2022-05-30 MED ORDER — ROSUVASTATIN CALCIUM 40 MG PO TABS: 40.0000 mg | ORAL_TABLET | Freq: Every day | ORAL | 3 refills | Status: DC

## 2022-05-30 NOTE — Progress Notes (Signed)
Patient referred by Lonell Grandchild, NP Sadie Haber at [facility]) for chest pain  Subjective:   Dale Mitchell, male    DOB: 1951-02-16, 71 y.o.   MRN: [id number]   Chief Complaint  Patient presents with   Hyperlipidemia   Hypertension    HPI  71 y.o. Caucasian male with hypertension, mixed hyperlipidemia  Patient is doing well. He is exercising regularly at [facility] without any complaints of chest pain, shortness of breath. Blood pressure is now much better controlled.   Initial consultation visit 08/2021: Patient works as a Music therapist.  Prior to [place], he used to be regular with physical activity.  Since COVID, his physical activity has been limited yard work, family moved.  In the last few weeks, he has had multiple daily episodes of left-sided chest pain with physical exertion, such as holding mulch.  All of these episodes also occurred during driving.  Episodes seem to improve with rest, last for couple minutes.  He has had some associated exertional dyspnea, currently absent.  He denies any palpitations, orthopnea, PND, leg edema symptoms.  He is a former smoker, quit in [place].  He is on hydrochlorothiazide 12.5 mg daily for hypertension.  Blood pressure readings are generally well controlled.  He is not on any lipid-lowering therapy at this time.  Current Outpatient Medications:    aspirin EC 81 MG tablet, Take 1 tablet (81 mg total) by mouth daily. Swallow whole., Disp: 90 tablet, Rfl: 3   isosorbide mononitrate (IMDUR) 30 MG 24 hr tablet, Take 1 tablet (30 mg total) by mouth daily., Disp: 90 tablet, Rfl: 3   nitroGLYCERIN (NITROSTAT) 0.4 MG SL tablet, Place 1 tablet (0.4 mg total) under the tongue every 5 (five) minutes as needed for chest pain., Disp: 30 tablet, Rfl: 3   rosuvastatin (CRESTOR) 40 MG tablet, Take 1 tablet (40 mg total) by mouth daily., Disp: 90 tablet, Rfl: 3   valsartan-hydrochlorothiazide (DIOVAN HCT) 160-12.5 MG tablet, Take 1 tablet by mouth daily., Disp: 30  tablet, Rfl: 2   Cardiovascular and other pertinent studies:  Reviewed external labs and tests, independently interpreted  EKG 05/30/2022: Sinus rhythm 63 bpm First degree A-V block  Poor R-wave progression   CT cardiac scoring 10/04/2021: LM: 29 LAD: 684 LCx: 231 RCA: 247   Total Agatston Score: 1161 MESA database percentile: 82%  Exercise Tetrofosmin stress test 09/23/2021: Exercise nuclear stress test was performed using Bruce protocol. Patient reached 8 METS, and 70% of age predicted maximum heart rate. Exercise capacity was low. No chest pain reported. Normal heart rate response. Resting hypertension SBP 162 mmHg, hypertensive response with peak BP 204/60 mmHg. Stress EKG revealed no ischemic changes. Normal myocardial perfusion. Stress LVEF 86%. Low risk study.   Echocardiogram 09/06/2021:  Normal LV systolic function with visual EF 60-65%. Left ventricle cavity  is normal in size. No obvious regional wall motion abnormalities. Normal  left ventricular wall thickness. Normal global wall motion. Normal  diastolic filling pattern, normal LAP.  Left atrial cavity is mildly dilated.  Mild tricuspid regurgitation. No evidence of pulmonary hypertension. RVSP  measures 30 mmHg.  Mild pulmonic regurgitation.  No prior study for comparison.    EKG 08/28/2021: Sinus rhythm 70 bpm First degree AV block Low voltage limb leads  Recent labs: 03/19/2022: Chol 126, TG 95, HDL 53, LDL 55  07/22/2021: Glucose 97, BUN/Cr 22/0.91. EGFR 91. Na/K 140/4.5. Rest of the CMP normal Chol 223, TG 130, HDL 53, LDL 147   Review  of Systems  Cardiovascular:  Negative for chest pain, dyspnea on exertion, leg swelling, palpitations and syncope.         Vitals:   05/30/22 1321  BP: 137/62  Pulse: 68  SpO2: 97%     Body mass index is 32.92 kg/m. Filed Weights   05/30/22 1321  Weight: 256 lb 6.4 oz (116.3 kg)     Objective:   Physical Exam Vitals and nursing note reviewed.   Constitutional:      General: He is not in acute distress. Neck:     Vascular: No JVD.  Cardiovascular:     Rate and Rhythm: Normal rate and regular rhythm.     Heart sounds: Normal heart sounds. No murmur heard. Pulmonary:     Effort: Pulmonary effort is normal.     Breath sounds: Normal breath sounds. No wheezing or rales.  Musculoskeletal:     Right lower leg: No edema.     Left lower leg: No edema.           Visit diagnoses:   ICD-10-CM   1. Angina pectoris (HCC)  I20.9 nitroGLYCERIN (NITROSTAT) 0.4 MG SL tablet    2. Essential hypertension  I10 EKG 12-Lead    valsartan-hydrochlorothiazide (DIOVAN HCT) 160-12.5 MG tablet    aspirin EC 81 MG tablet    3. Mixed hyperlipidemia  E78.2 rosuvastatin (CRESTOR) 40 MG tablet       Orders Placed This Encounter  Procedures   EKG 12-Lead     Assessment & Recommendations:   71 y.o. Caucasian male with hypertension, mixed hyperlipidemia  Angina pectoris: Multivessel 92nd percentile calcium score (09/2021), without ischemia on stress testing (08/2021) Symptoms controlled on Imdur. Not on beta blocker due to resting bradycardia. Continue Aspirin 81 mg, Crestor 40 mg, Imdur 30 mg daily. LDL down from 147 to 55.  Hypertension: Now better controlled. Continue valsartan-HCTZ 160-12.5 mg daily.  Mixed hyperlipidemia: As above  F/u in 6 months    Nigel Mormon, MD Pager: [phone] Office: [phone]

## 2022-07-24 DIAGNOSIS — E782 Mixed hyperlipidemia: Secondary | ICD-10-CM | POA: Diagnosis not present

## 2022-07-24 DIAGNOSIS — Z5320 Procedure and treatment not carried out because of patient's decision for unspecified reasons: Secondary | ICD-10-CM | POA: Diagnosis not present

## 2022-07-24 DIAGNOSIS — I10 Essential (primary) hypertension: Secondary | ICD-10-CM | POA: Diagnosis not present

## 2022-07-24 DIAGNOSIS — Z125 Encounter for screening for malignant neoplasm of prostate: Secondary | ICD-10-CM | POA: Diagnosis not present

## 2022-07-24 DIAGNOSIS — Z2821 Immunization not carried out because of patient refusal: Secondary | ICD-10-CM | POA: Diagnosis not present

## 2022-07-24 DIAGNOSIS — L570 Actinic keratosis: Secondary | ICD-10-CM | POA: Diagnosis not present

## 2022-07-24 DIAGNOSIS — Z79899 Other long term (current) drug therapy: Secondary | ICD-10-CM | POA: Diagnosis not present

## 2022-07-24 DIAGNOSIS — Z6831 Body mass index (BMI) 31.0-31.9, adult: Secondary | ICD-10-CM | POA: Diagnosis not present

## 2022-07-24 DIAGNOSIS — Z0000 Encounter for general adult medical examination without abnormal findings: Secondary | ICD-10-CM | POA: Diagnosis not present

## 2022-10-22 ENCOUNTER — Ambulatory Visit
Admission: RE | Admit: 2022-10-22 | Discharge: 2022-10-22 | Disposition: A | Discharge status: Home / Self Care | Payer: PPO | Source: Ambulatory Visit | Attending: Family Medicine | Admitting: Family Medicine

## 2022-10-22 ENCOUNTER — Other Ambulatory Visit: Payer: Self-pay | Admitting: Family Medicine

## 2022-10-22 DIAGNOSIS — R059 Cough, unspecified: Secondary | ICD-10-CM

## 2022-10-22 DIAGNOSIS — R0602 Shortness of breath: Secondary | ICD-10-CM | POA: Diagnosis not present

## 2022-10-22 DIAGNOSIS — J22 Unspecified acute lower respiratory infection: Secondary | ICD-10-CM | POA: Diagnosis not present

## 2022-10-22 DIAGNOSIS — R051 Acute cough: Secondary | ICD-10-CM | POA: Diagnosis not present

## 2022-10-22 DIAGNOSIS — Z6832 Body mass index (BMI) 32.0-32.9, adult: Secondary | ICD-10-CM | POA: Diagnosis not present

## 2022-10-22 DIAGNOSIS — J209 Acute bronchitis, unspecified: Secondary | ICD-10-CM | POA: Diagnosis not present

## 2022-12-01 ENCOUNTER — Encounter: Payer: Self-pay | Admitting: Cardiology

## 2022-12-01 ENCOUNTER — Ambulatory Visit: Payer: PPO | Admitting: Cardiology

## 2022-12-01 VITALS — BP 155/71 | HR 79 | Resp 16 | Ht 74.0 in | Wt 263.0 lb

## 2022-12-01 DIAGNOSIS — I10 Essential (primary) hypertension: Secondary | ICD-10-CM

## 2022-12-01 DIAGNOSIS — E782 Mixed hyperlipidemia: Secondary | ICD-10-CM

## 2022-12-01 DIAGNOSIS — I209 Angina pectoris, unspecified: Secondary | ICD-10-CM | POA: Diagnosis not present

## 2022-12-01 MED ORDER — ASPIRIN 81 MG PO TBEC: 81.0000 mg | DELAYED_RELEASE_TABLET | Freq: Every day | ORAL | 3 refills | Status: DC

## 2022-12-01 MED ORDER — ROSUVASTATIN CALCIUM 40 MG PO TABS
40.0000 mg | ORAL_TABLET | Freq: Every day | ORAL | 3 refills | Status: DC
Start: 2022-12-01 — End: 2024-01-04

## 2022-12-01 MED ORDER — VALSARTAN-HYDROCHLOROTHIAZIDE 160-12.5 MG PO TABS
1.0000 | ORAL_TABLET | Freq: Every day | ORAL | 3 refills | Status: DC
Start: 2022-12-01 — End: 2023-12-31

## 2022-12-01 MED ORDER — METOPROLOL SUCCINATE ER 50 MG PO TB24: 50.0000 mg | ORAL_TABLET | Freq: Every day | ORAL | 3 refills | Status: DC

## 2022-12-01 NOTE — Progress Notes (Signed)
Patient referred by [name], NP Deboraha Sprang at [facility]) for chest pain  Subjective:   Dale Mitchell, male    DOB: [birth year], 72 y.o.   MRN: [id number]   Chief Complaint  Patient presents with   Chest Pain   Follow-up    6 months    HPI  72 y.o. Caucasian male with hypertension, mixed hyperlipidemia  Patient has occasional chest/left arm pain, but not with activity.  That said, it is not being as physically active as he was 6 months ago, when he was regularly working out at [employer].  Currently, his physical activity primarily involves yard work.    Initial consultation visit 08/2021: Patient works as a Firefighter.  Prior to COVID, he used to be regular with physical activity.  Since COVID, his physical activity has been limited yard work, family moved.  In the last few weeks, he has had multiple daily episodes of left-sided chest pain with physical exertion, such as holding mulch.  All of these episodes also occurred during driving.  Episodes seem to improve with rest, last for couple minutes.  He has had some associated exertional dyspnea, currently absent.  He denies any palpitations, orthopnea, PND, leg edema symptoms.  He is a former smoker, quit in 1990s.  He is on hydrochlorothiazide 12.5 mg daily for hypertension.  Blood pressure readings are generally well controlled.  He is not on any lipid-lowering therapy at this time.  Current Outpatient Medications:    aspirin EC 81 MG tablet, Take 1 tablet (81 mg total) by mouth daily. Swallow whole., Disp: 90 tablet, Rfl: 3   rosuvastatin (CRESTOR) 40 MG tablet, Take 1 tablet (40 mg total) by mouth daily., Disp: 90 tablet, Rfl: 3   valsartan-hydrochlorothiazide (DIOVAN HCT) 160-12.5 MG tablet, Take 1 tablet by mouth daily., Disp: 90 tablet, Rfl: 3   Cardiovascular and other pertinent studies:  Reviewed external labs and tests, independently interpreted  EKG 12/01/2022: Sinus rhythm 72 bpm First degree A-V block   Nonspecific ST-T abnormality  CT cardiac scoring 10/04/2021: LM: 29 LAD: 684 LCx: 231 RCA: 247   Total Agatston Score: 1161 MESA database percentile: 82%  Exercise Tetrofosmin stress test 09/23/2021: Exercise nuclear stress test was performed using Bruce protocol. Patient reached 8 METS, and 87% of age predicted maximum heart rate. Exercise capacity was low. No chest pain reported. Normal heart rate response. Resting hypertension SBP 162 mmHg, hypertensive response with peak BP 204/60 mmHg. Stress EKG revealed no ischemic changes. Normal myocardial perfusion. Stress LVEF 86%. Low risk study.   Echocardiogram 09/06/2021:  Normal LV systolic function with visual EF 60-65%. Left ventricle cavity  is normal in size. No obvious regional wall motion abnormalities. Normal  left ventricular wall thickness. Normal global wall motion. Normal  diastolic filling pattern, normal LAP.  Left atrial cavity is mildly dilated.  Mild tricuspid regurgitation. No evidence of pulmonary hypertension. RVSP  measures 30 mmHg.  Mild pulmonic regurgitation.  No prior study for comparison.    EKG 08/28/2021: Sinus rhythm 70 bpm First degree AV block Low voltage limb leads  Recent labs: 03/19/2022: Chol 126, TG 95, HDL 53, LDL 55  07/22/2021: Glucose 97, BUN/Cr 22/0.91. EGFR 91. Na/K 140/4.5. Rest of the CMP normal Chol 223, TG 130, HDL 53, LDL 147   Review of Systems  Cardiovascular:  Positive for chest pain. Negative for dyspnea on exertion, leg swelling, palpitations and syncope.         Vitals:   12/01/22 1259  BP: (!) 155/71  Pulse: 79  Resp: 16  SpO2: 96%     Body mass index is 33.77 kg/m. Filed Weights   12/01/22 1259  Weight: 263 lb (119.3 kg)     Objective:   Physical Exam Vitals and nursing note reviewed.  Constitutional:      General: He is not in acute distress. Neck:     Vascular: No JVD.  Cardiovascular:     Rate and Rhythm: Normal rate and regular rhythm.      Heart sounds: Normal heart sounds. No murmur heard. Pulmonary:     Effort: Pulmonary effort is normal.     Breath sounds: Normal breath sounds. No wheezing or rales.  Musculoskeletal:     Right lower leg: No edema.     Left lower leg: No edema.           Visit diagnoses:   ICD-10-CM   1. Angina pectoris (HCC)  I20.9 EKG 12-Lead    2. Essential hypertension  I10 valsartan-hydrochlorothiazide (DIOVAN HCT) 160-12.5 MG tablet    aspirin EC 81 MG tablet    3. Mixed hyperlipidemia  E78.2 rosuvastatin (CRESTOR) 40 MG tablet       Orders Placed This Encounter  Procedures   EKG 12-Lead     Assessment & Recommendations:   72 y.o. Caucasian male with hypertension, mixed hyperlipidemia  Angina pectoris: Multivessel 92nd percentile calcium score (09/2021), without ischemia on stress testing (08/2021) Recommend adding metoprolol succinate 50 mg daily.   Continue Aspirin 81 mg, Crestor 40 mg, Imdur 30 mg daily. LDL down from 147 to 55.  Hypertension: Uncontrolled.  Hopefully metoprolol will help.   Continue valsartan-HCTZ 160-12.5 mg daily.  Mixed hyperlipidemia: As above  F/u in 2 months    Elder Negus, MD Pager: [phone] Office: [phone]

## 2023-02-02 ENCOUNTER — Ambulatory Visit: Payer: PPO | Admitting: Cardiology

## 2023-02-02 ENCOUNTER — Encounter: Payer: Self-pay | Admitting: Cardiology

## 2023-02-02 VITALS — BP 147/74 | HR 57 | Resp 17 | Ht 74.0 in | Wt 267.0 lb

## 2023-02-02 DIAGNOSIS — R6889 Other general symptoms and signs: Secondary | ICD-10-CM | POA: Insufficient documentation

## 2023-02-02 DIAGNOSIS — E782 Mixed hyperlipidemia: Secondary | ICD-10-CM

## 2023-02-02 DIAGNOSIS — R931 Abnormal findings on diagnostic imaging of heart and coronary circulation: Secondary | ICD-10-CM

## 2023-02-02 DIAGNOSIS — I10 Essential (primary) hypertension: Secondary | ICD-10-CM

## 2023-02-02 NOTE — Progress Notes (Signed)
Patient referred by [name], NP Deboraha Sprang at [facility]) for chest pain  Subjective:   Dale Mitchell, male    DOB: 1950-10-23, 72 y.o.   MRN: [id number]   Chief Complaint  Patient presents with   Chest Pain   Hypertension   Follow-up    2 month    HPI  72 y.o. Caucasian male with hypertension, mixed hyperlipidemia, elevated coronary calcium score  Patient denies any chest pain, but has noticed that his exercise capacity is lower on a hot summer day, compared to a year ago.  Blood pressure remains elevated.  He is being few pounds through last year or so.     Initial consultation visit 08/2021: Patient works as a Firefighter.  Prior to [place], he used to be regular with physical activity.  Since COVID, his physical activity has been limited yard work, family moved.  In the last few weeks, he has had multiple daily episodes of left-sided chest pain with physical exertion, such as holding mulch.  All of these episodes also occurred during driving.  Episodes seem to improve with rest, last for couple minutes.  He has had some associated exertional dyspnea, currently absent.  He denies any palpitations, orthopnea, PND, leg edema symptoms.  He is a former smoker, quit in 1990s.  He is on hydrochlorothiazide 12.5 mg daily for hypertension.  Blood pressure readings are generally well controlled.  He is not on any lipid-lowering therapy at this time.  Current Outpatient Medications:    aspirin EC 81 MG tablet, Take 1 tablet (81 mg total) by mouth daily. Swallow whole., Disp: 90 tablet, Rfl: 3   metoprolol succinate (TOPROL-XL) 50 MG 24 hr tablet, Take 1 tablet (50 mg total) by mouth daily. Take with or immediately following a meal., Disp: 90 tablet, Rfl: 3   rosuvastatin (CRESTOR) 40 MG tablet, Take 1 tablet (40 mg total) by mouth daily., Disp: 90 tablet, Rfl: 3   valsartan-hydrochlorothiazide (DIOVAN HCT) 160-12.5 MG tablet, Take 1 tablet by mouth daily., Disp: 90 tablet, Rfl:  3   Cardiovascular and other pertinent studies:  Reviewed external labs and tests, independently interpreted  EKG 12/01/2022: Sinus rhythm 72 bpm First degree A-V block  Nonspecific ST-T abnormality  CT cardiac scoring 10/04/2021: LM: 29 LAD: 684 LCx: 231 RCA: 247   Total Agatston Score: 1161 MESA database percentile: 82%  Exercise Tetrofosmin stress test 09/23/2021: Exercise nuclear stress test was performed using Bruce protocol. Patient reached 8 METS, and 87% of age predicted maximum heart rate. Exercise capacity was low. No chest pain reported. Normal heart rate response. Resting hypertension SBP 162 mmHg, hypertensive response with peak BP 204/60 mmHg. Stress EKG revealed no ischemic changes. Normal myocardial perfusion. Stress LVEF 86%. Low risk study.   Echocardiogram 09/06/2021:  Normal LV systolic function with visual EF 60-65%. Left ventricle cavity  is normal in size. No obvious regional wall motion abnormalities. Normal  left ventricular wall thickness. Normal global wall motion. Normal  diastolic filling pattern, normal LAP.  Left atrial cavity is mildly dilated.  Mild tricuspid regurgitation. No evidence of pulmonary hypertension. RVSP  measures 30 mmHg.  Mild pulmonic regurgitation.  No prior study for comparison.    EKG 08/28/2021: Sinus rhythm 70 bpm First degree AV block Low voltage limb leads  Recent labs: 03/19/2022: Chol 126, TG 95, HDL 53, LDL 55  07/22/2021: Glucose 97, BUN/Cr 22/0.91. EGFR 91. Na/K 140/4.5. Rest of the CMP normal Chol 223, TG 130, HDL 53, LDL  147   Review of Systems  Cardiovascular:  Positive for dyspnea on exertion. Negative for chest pain, leg swelling, palpitations and syncope.         Vitals:   02/02/23 1059  BP: (!) 147/74  Pulse: (!) 57  Resp: 17  SpO2: 94%      Body mass index is 34.28 kg/m. Filed Weights   02/02/23 1059  Weight: 267 lb (121.1 kg)      Objective:   Physical Exam Vitals and nursing  note reviewed.  Constitutional:      General: He is not in acute distress. Neck:     Vascular: No JVD.  Cardiovascular:     Rate and Rhythm: Normal rate and regular rhythm.     Heart sounds: Normal heart sounds. No murmur heard. Pulmonary:     Effort: Pulmonary effort is normal.     Breath sounds: Normal breath sounds. No wheezing or rales.  Musculoskeletal:     Right lower leg: No edema.     Left lower leg: No edema.           Visit diagnoses: No diagnosis found.    No orders of the defined types were placed in this encounter.    Assessment & Recommendations:   72 y.o. Caucasian male with hypertension, mixed hyperlipidemia, elevated coronary calcium score  Exertional dyspnea, decreased exercise tolerance: No specific anginal symptoms at this time.  Symptoms could be multifactorial, including hypertension, obesity, deconditioning, as well as angina equivalent.  Calcium score 82nd percentile, but no ischemia on stress testing in 08/2021. Recommend continued hypertension control, diet and lifestyle modification to help with weight loss.  Will obtain echocardiogram.  Knows to lose weight before making any further changes to his medical therapy. I will reassess in 4 weeks.  Elevated coronary calcium score: Multivessel 92nd percentile calcium score (09/2021), without ischemia on stress testing (08/2021) Recommend adding metoprolol succinate 50 mg daily.   Continue Aspirin 81 mg, Crestor 40 mg, Imdur 30 mg daily. LDL down from 147 to 55.  Hypertension: Uncontrolled.  No ischemic diet and no surgeries before making any further changes.   Mixed hyperlipidemia: As above  F/u in 4 weeks    Elder Negus, MD Pager: [phone] Office: [phone]

## 2023-02-17 ENCOUNTER — Ambulatory Visit: Payer: PPO

## 2023-02-17 DIAGNOSIS — I361 Nonrheumatic tricuspid (valve) insufficiency: Secondary | ICD-10-CM

## 2023-02-17 DIAGNOSIS — R6889 Other general symptoms and signs: Secondary | ICD-10-CM

## 2023-02-24 NOTE — Progress Notes (Deleted)
Patient referred by [name], NP Deboraha Sprang at [facility]) for chest pain  Subjective:   Dale Mitchell, male    DOB: 01-07-1951, 72 y.o.   MRN: [id number]   No chief complaint on file.   HPI  72 y.o. Caucasian male with hypertension, mixed hyperlipidemia, elevated coronary calcium score  Patient denies any chest pain, but has noticed that his exercise capacity is lower on a hot summer day, compared to a year ago.  Blood pressure remains elevated.  He is being few pounds through last year or so.     Initial consultation visit 08/2021: Patient works as a Firefighter.  Prior to COVID, he used to be regular with physical activity.  Since COVID, his physical activity has been limited yard work, family moved.  In the last few weeks, he has had multiple daily episodes of left-sided chest pain with physical exertion, such as holding mulch.  All of these episodes also occurred during driving.  Episodes seem to improve with rest, last for couple minutes.  He has had some associated exertional dyspnea, currently absent.  He denies any palpitations, orthopnea, PND, leg edema symptoms.  He is a former smoker, quit in 1990s.  He is on hydrochlorothiazide 12.5 mg daily for hypertension.  Blood pressure readings are generally well controlled.  He is not on any lipid-lowering therapy at this time.  Current Outpatient Medications:    aspirin EC 81 MG tablet, Take 1 tablet (81 mg total) by mouth daily. Swallow whole., Disp: 90 tablet, Rfl: 3   metoprolol succinate (TOPROL-XL) 50 MG 24 hr tablet, Take 1 tablet (50 mg total) by mouth daily. Take with or immediately following a meal., Disp: 90 tablet, Rfl: 3   rosuvastatin (CRESTOR) 40 MG tablet, Take 1 tablet (40 mg total) by mouth daily., Disp: 90 tablet, Rfl: 3   valsartan-hydrochlorothiazide (DIOVAN HCT) 160-12.5 MG tablet, Take 1 tablet by mouth daily., Disp: 90 tablet, Rfl: 3   Cardiovascular and other pertinent studies:  Reviewed external  labs and tests, independently interpreted  EKG 12/01/2022: Sinus rhythm 72 bpm First degree A-V block  Nonspecific ST-T abnormality  Echocardiogram 02/17/2023: Left ventricle cavity is normal in size. Normal left ventricular wall thickness. Mild global hypokinesis. LVEF 40-45%. Normal global wall motion. Unable to evaluate diastolic function due to atrial fibrillation. Left atrial cavity is mildly dilated. Right ventricle cavity is mildly dilated. Low normal right ventricular function. Trileaflet aortic valve. Trace aortic regurgitation. Mild (Grade I) mitral regurgitation. Mild tricuspid regurgitation.  No evidence of pulmonary hypertension. No significant change compared to previous study on 10/16/2022.  CT cardiac scoring 10/04/2021: LM: 29 LAD: 684 LCx: 231 RCA: 247   Total Agatston Score: 1161 MESA database percentile: 82%  Exercise Tetrofosmin stress test 09/23/2021: Exercise nuclear stress test was performed using Bruce protocol. Patient reached 8 METS, and 87% of age predicted maximum heart rate. Exercise capacity was low. No chest pain reported. Normal heart rate response. Resting hypertension SBP 162 mmHg, hypertensive response with peak BP 204/60 mmHg. Stress EKG revealed no ischemic changes. Normal myocardial perfusion. Stress LVEF 86%. Low risk study.   Echocardiogram 09/06/2021:  Normal LV systolic function with visual EF 60-65%. Left ventricle cavity  is normal in size. No obvious regional wall motion abnormalities. Normal  left ventricular wall thickness. Normal global wall motion. Normal  diastolic filling pattern, normal LAP.  Left atrial cavity is mildly dilated.  Mild tricuspid regurgitation. No evidence of pulmonary hypertension. RVSP  measures 30  mmHg.  Mild pulmonic regurgitation.  No prior study for comparison.    EKG 08/28/2021: Sinus rhythm 70 bpm First degree AV block Low voltage limb leads  Recent labs: 03/19/2022: Chol 126, TG 95, HDL 53, LDL  55  07/22/2021: Glucose 97, BUN/Cr 22/0.91. EGFR 91. Na/K 140/4.5. Rest of the CMP normal Chol 223, TG 130, HDL 53, LDL 147   Review of Systems  Cardiovascular:  Positive for dyspnea on exertion. Negative for chest pain, leg swelling, palpitations and syncope.         There were no vitals filed for this visit.     There is no height or weight on file to calculate BMI. There were no vitals filed for this visit.     Objective:   Physical Exam Vitals and nursing note reviewed.  Constitutional:      General: He is not in acute distress. Neck:     Vascular: No JVD.  Cardiovascular:     Rate and Rhythm: Normal rate and regular rhythm.     Heart sounds: Normal heart sounds. No murmur heard. Pulmonary:     Effort: Pulmonary effort is normal.     Breath sounds: Normal breath sounds. No wheezing or rales.  Musculoskeletal:     Right lower leg: No edema.     Left lower leg: No edema.           Visit diagnoses: No diagnosis found.    No orders of the defined types were placed in this encounter.    Assessment & Recommendations:   72 y.o. Caucasian male with hypertension, mixed hyperlipidemia, elevated coronary calcium score  Exertional dyspnea, decreased exercise tolerance: No specific anginal symptoms at this time.  Symptoms could be multifactorial, including hypertension, obesity, deconditioning, as well as angina equivalent.  Calcium score 82nd percentile, but no ischemia on stress testing in 08/2021. Recommend continued hypertension control, diet and lifestyle modification to help with weight loss.  Will obtain echocardiogram.  Knows to lose weight before making any further changes to his medical therapy. I will reassess in 4 weeks.  Elevated coronary calcium score: Multivessel 92nd percentile calcium score (09/2021), without ischemia on stress testing (08/2021) Recommend adding metoprolol succinate 50 mg daily.   Continue Aspirin 81 mg, Crestor 40 mg, Imdur 30  mg daily. LDL down from 147 to 55.  Hypertension: Uncontrolled.  No ischemic diet and no surgeries before making any further changes.   Mixed hyperlipidemia: As above  F/u in 4 weeks    Elder Negus, MD Pager: [phone] Office: [phone]

## 2023-03-03 ENCOUNTER — Ambulatory Visit: Payer: PPO | Admitting: Cardiology

## 2023-03-03 ENCOUNTER — Encounter: Payer: Self-pay | Admitting: Cardiology

## 2023-03-03 VITALS — BP 151/76 | HR 53 | Resp 16 | Ht 74.0 in | Wt 269.0 lb

## 2023-03-03 DIAGNOSIS — E782 Mixed hyperlipidemia: Secondary | ICD-10-CM

## 2023-03-03 DIAGNOSIS — I10 Essential (primary) hypertension: Secondary | ICD-10-CM

## 2023-03-03 DIAGNOSIS — R6889 Other general symptoms and signs: Secondary | ICD-10-CM

## 2023-03-03 DIAGNOSIS — R931 Abnormal findings on diagnostic imaging of heart and coronary circulation: Secondary | ICD-10-CM

## 2023-03-03 NOTE — Progress Notes (Signed)
Patient referred by [name], NP Deboraha Sprang at [facility]) for chest pain  Subjective:   Dale Mitchell, male    DOB: 11/11/1950, 72 y.o.   MRN: [id number]   Chief Complaint  Patient presents with   Follow-up   Dyspnea on exertion    HPI  72 y.o. Caucasian male with hypertension, mixed hyperlipidemia, elevated coronary calcium score  Reviewed recent echocardiogram results with the patient, details below.  Overall, he is feeling well, but has shortness of breath episodes after working outside in 90 degree for about 30 minutes.  He denies any chest pain.  Blood pressure is elevated today, he does not check it regularly at home.  He does report episodes of lightheadedness on standing up too quickly.  Initial consultation visit 08/2021: Patient works as a Firefighter.  Prior to COVID, he used to be regular with physical activity.  Since COVID, his physical activity has been limited yard work, family moved.  In the last few weeks, he has had multiple daily episodes of left-sided chest pain with physical exertion, such as holding mulch.  All of these episodes also occurred during driving.  Episodes seem to improve with rest, last for couple minutes.  He has had some associated exertional dyspnea, currently absent.  He denies any palpitations, orthopnea, PND, leg edema symptoms.  He is a former smoker, quit in 1990s.  He is on hydrochlorothiazide 12.5 mg daily for hypertension.  Blood pressure readings are generally well controlled.  He is not on any lipid-lowering therapy at this time.  Current Outpatient Medications:    aspirin EC 81 MG tablet, Take 1 tablet (81 mg total) by mouth daily. Swallow whole., Disp: 90 tablet, Rfl: 3   metoprolol succinate (TOPROL-XL) 50 MG 24 hr tablet, Take 1 tablet (50 mg total) by mouth daily. Take with or immediately following a meal., Disp: 90 tablet, Rfl: 3   rosuvastatin (CRESTOR) 40 MG tablet, Take 1 tablet (40 mg total) by mouth daily., Disp: 90  tablet, Rfl: 3   valsartan-hydrochlorothiazide (DIOVAN HCT) 160-12.5 MG tablet, Take 1 tablet by mouth daily., Disp: 90 tablet, Rfl: 3   Cardiovascular and other pertinent studies:  Reviewed external labs and tests, independently interpreted  Echocardiogram 02/17/2023: Left ventricle cavity is normal in size. Normal left ventricular wall thickness. Mild global hypokinesis. LVEF 40-45%. Normal global wall motion. Unable to evaluate diastolic function due to atrial fibrillation. Left atrial cavity is mildly dilated. Right ventricle cavity is mildly dilated. Low normal right ventricular function. Trileaflet aortic valve. Trace aortic regurgitation. Mild (Grade I) mitral regurgitation. Mild tricuspid regurgitation. No evidence of pulmonary hypertension. No significant change compared to previous study on 10/16/2022.  EKG 12/01/2022: Sinus rhythm 72 bpm First degree A-V block  Nonspecific ST-T abnormality  CT cardiac scoring 10/04/2021: LM: 29 LAD: 684 LCx: 231 RCA: 247   Total Agatston Score: 1161 MESA database percentile: 82%  Exercise Tetrofosmin stress test 09/23/2021: Exercise nuclear stress test was performed using Bruce protocol. Patient reached 8 METS, and 87% of age predicted maximum heart rate. Exercise capacity was low. No chest pain reported. Normal heart rate response. Resting hypertension SBP 162 mmHg, hypertensive response with peak BP 204/60 mmHg. Stress EKG revealed no ischemic changes. Normal myocardial perfusion. Stress LVEF 86%. Low risk study.   Echocardiogram 09/06/2021:  Normal LV systolic function with visual EF 60-65%. Left ventricle cavity  is normal in size. No obvious regional wall motion abnormalities. Normal  left ventricular wall thickness. Normal global  wall motion. Normal  diastolic filling pattern, normal LAP.  Left atrial cavity is mildly dilated.  Mild tricuspid regurgitation. No evidence of pulmonary hypertension. RVSP  measures 30 mmHg.  Mild  pulmonic regurgitation.  No prior study for comparison.    EKG 08/28/2021: Sinus rhythm 70 bpm First degree AV block Low voltage limb leads  Recent labs: 03/19/2022: Chol 126, TG 95, HDL 53, LDL 55  07/22/2021: Glucose 97, BUN/Cr 22/0.91. EGFR 91. Na/K 140/4.5. Rest of the CMP normal Chol 223, TG 130, HDL 53, LDL 147   Review of Systems  Cardiovascular:  Positive for dyspnea on exertion. Negative for chest pain, leg swelling, palpitations and syncope.         Vitals:   03/03/23 1104  BP: (!) 151/76  Pulse: (!) 53  Resp: 16  SpO2: 97%       Body mass index is 34.54 kg/m. Filed Weights   03/03/23 1104  Weight: 269 lb (122 kg)       Objective:   Physical Exam Vitals and nursing note reviewed.  Constitutional:      General: He is not in acute distress. Neck:     Vascular: No JVD.  Cardiovascular:     Rate and Rhythm: Normal rate and regular rhythm.     Heart sounds: Normal heart sounds. No murmur heard. Pulmonary:     Effort: Pulmonary effort is normal.     Breath sounds: Normal breath sounds. No wheezing or rales.  Musculoskeletal:     Right lower leg: No edema.     Left lower leg: No edema.           Visit diagnoses: No diagnosis found.    No orders of the defined types were placed in this encounter.    Assessment & Recommendations:   72 y.o. Caucasian male with hypertension, mixed hyperlipidemia, elevated coronary calcium score  Exertional dyspnea, decreased exercise tolerance: No specific anginal symptoms at this time.  Symptoms could be multifactorial, including hypertension, obesity, deconditioning, as well as angina equivalent.  Symptoms only seem to occur with working outside in 90 degree for about 30 minutes or so.  Calcium score 82nd percentile, but no ischemia on stress testing in 08/2021. If worsening symptoms, I would recommend coronary angiography to rule out obstructive multivessel CAD. At this time, patient would like to  continue medical management. I have recommended to keep a log of his blood pressure readings.  If blood pressure consistently remains >140 mmHg SBP, could increase valsartan- hydrochlorothiazide to 320-25 mg daily.  Elevated coronary calcium score: Multivessel 92nd percentile calcium score (09/2021), without ischemia on stress testing (08/2021) Recommend adding metoprolol succinate 50 mg daily.   Continue Aspirin 81 mg, Crestor 40 mg, Imdur 30 mg daily. LDL down from 147 to 55.  Hypertension: As above.  Mixed hyperlipidemia: As above  F/u in 2 months    Elder Negus, MD Pager: [phone] Office: [phone]

## 2023-03-04 ENCOUNTER — Ambulatory Visit: Payer: PPO | Admitting: Cardiology

## 2023-03-16 IMAGING — CT CT CARDIAC CORONARY ARTERY CALCIUM SCORE
3 series · 14 of 20 positions shown, 16 images · non-contrast
Comparison: None.

CLINICAL DATA: 70-year-old Caucasian male with hyperlipidemia

EXAM:
CT CARDIAC CORONARY ARTERY CALCIUM SCORE
TECHNIQUE: Non-contrast imaging through the heart was performed using
prospective ECG gating. Image post processing was performed on an
independent workstation, allowing for quantitative analysis of the
heart and coronary arteries. Note that this exam targets the heart
and the chest was not imaged in its entirety.

[Series 2: calcium scoring 2.00 qr36 bestdiast 71% hrt calciu · axial · 0.45mm/px · z∈[+1599,+1691]mm · 4 of 78 slices shown]
[im 16/78  vessel]
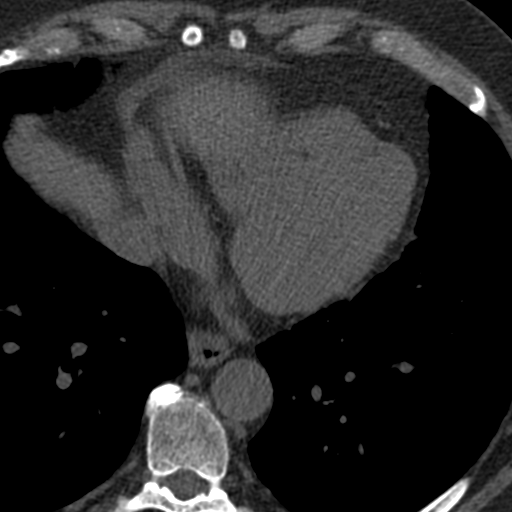
[im 31/78  vessel]
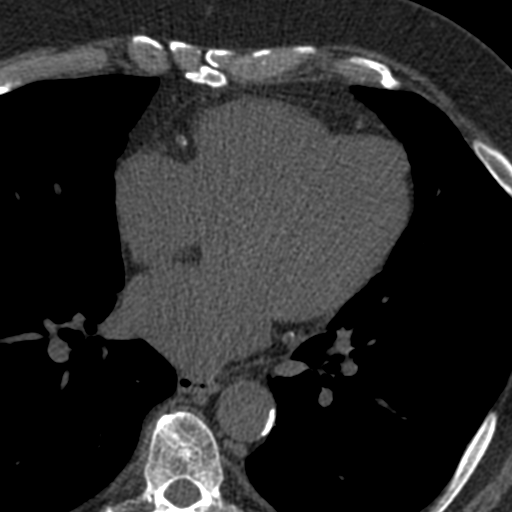
[im 47/78  vessel]
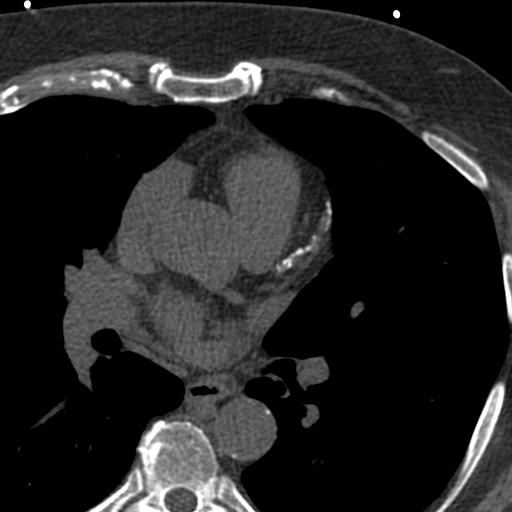
[im 62/78  vessel]
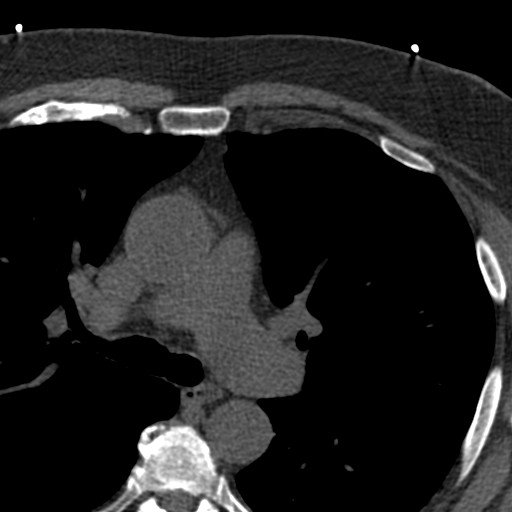

[Series 3: calcium scoring 2.00 br40 bestdiast 71% axial · axial · 0.62mm/px · z∈[+1593,+1697]mm · 5 of 78 slices shown, 7 images]
[im 13/78  vessel]
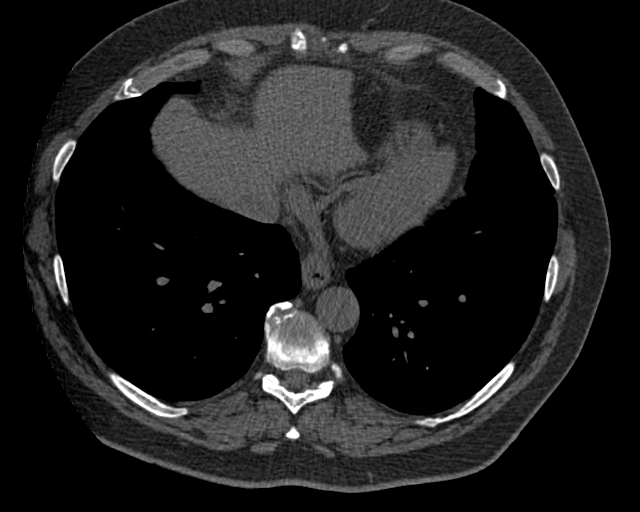
[im 13/78  lung]
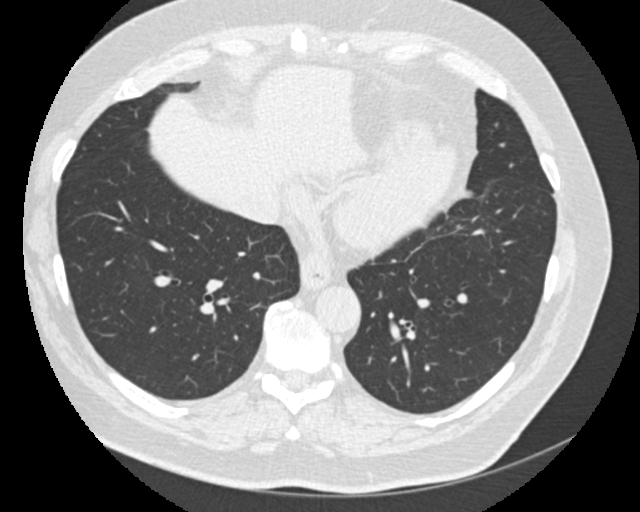
[im 26/78  vessel]
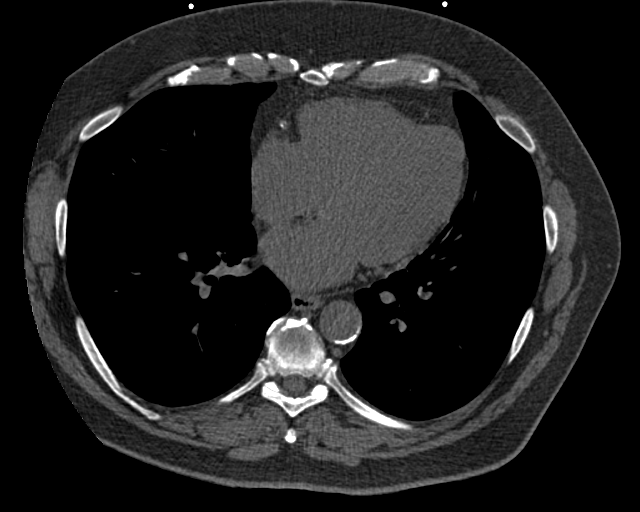
[im 39/78  vessel]
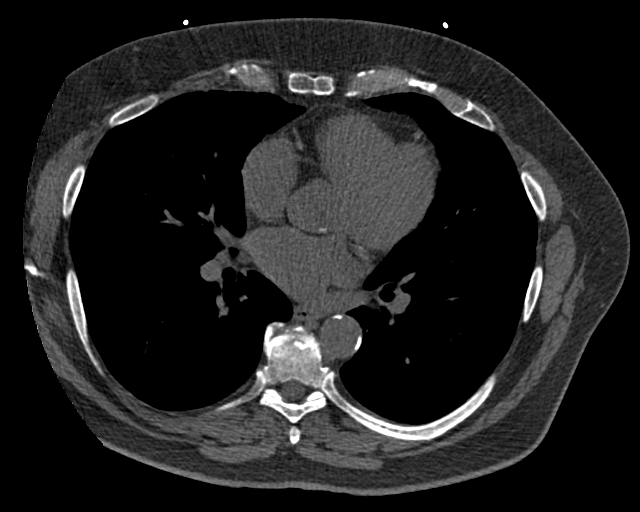
[im 52/78  vessel]
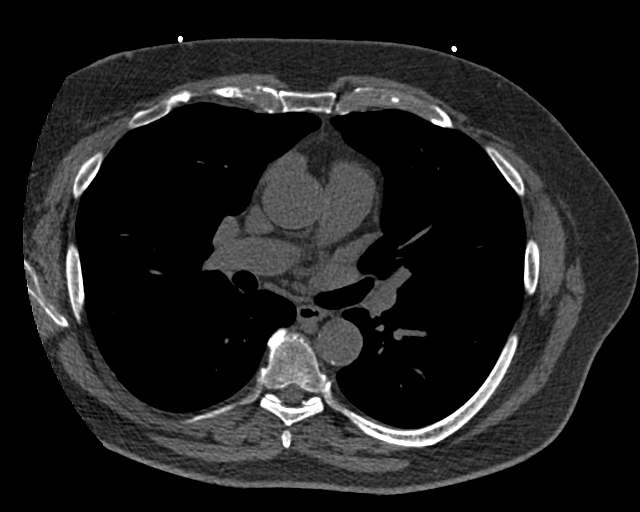
[im 65/78  vessel]
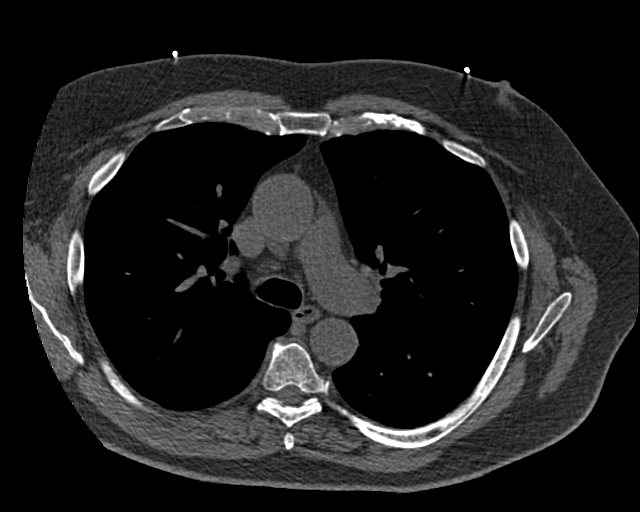
[im 65/78  lung]
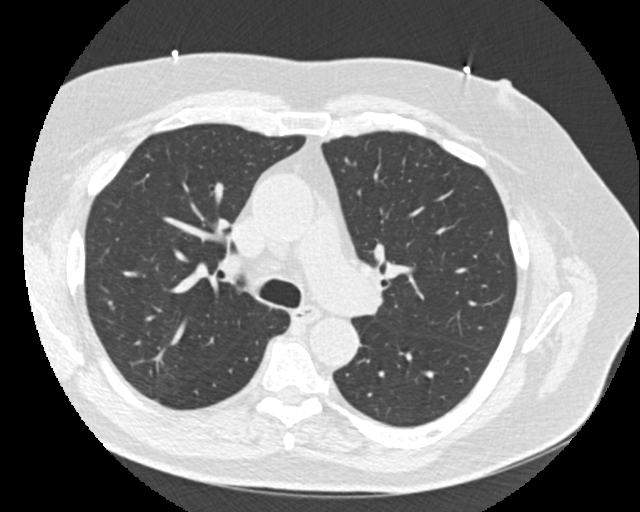

[Series 9: calcium scoring 2.00 br60 bestdiast 71% lungs · axial · 0.62mm/px · z∈[+1593,+1697]mm · 5 of 78 slices shown]
[im 13/78  vessel]
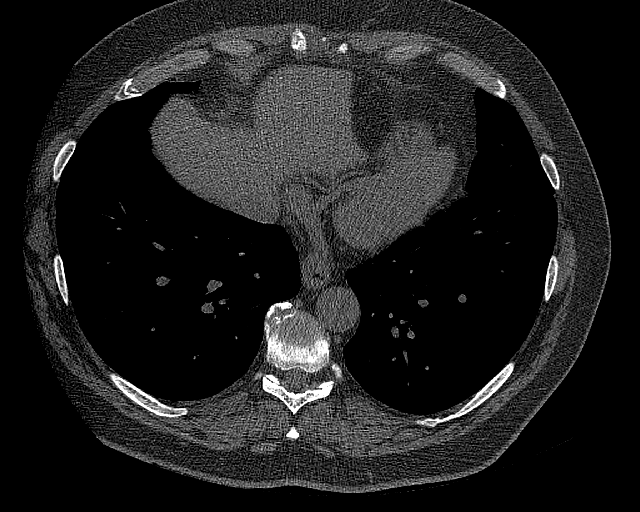
[im 26/78  vessel]
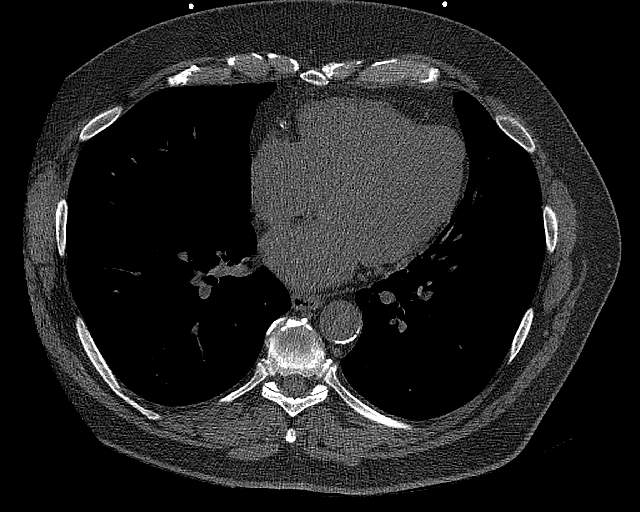
[im 39/78  vessel]
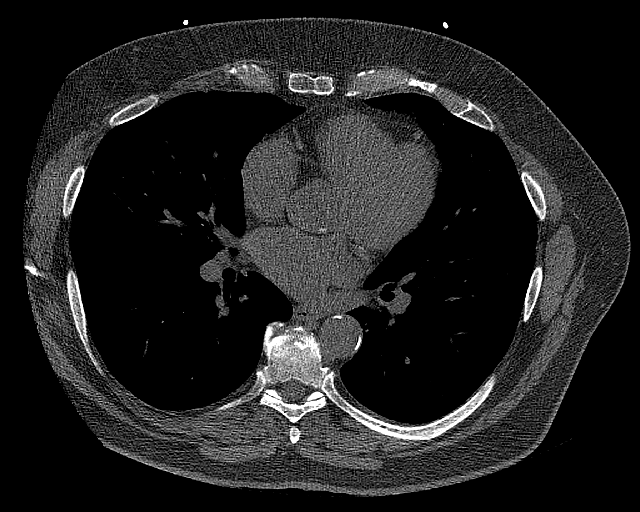
[im 52/78  vessel]
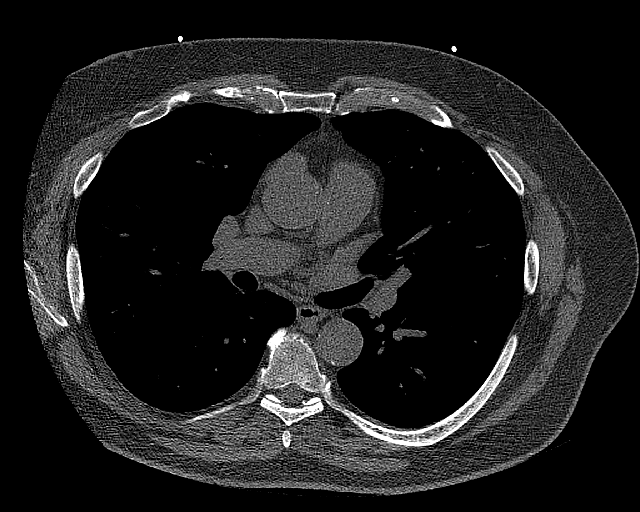
[im 65/78  vessel]
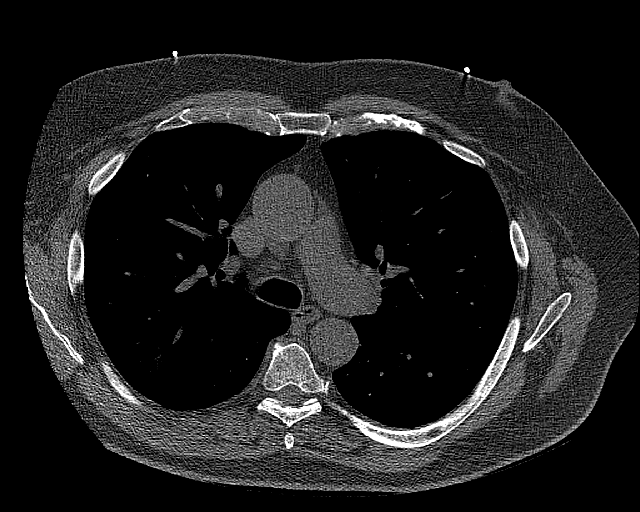

[14 of 20 positions shown; findings below may reference images not displayed]

FINDINGS: CORONARY CALCIUM SCORES:

Left Main: 29

LAD: 684

LCx: 231

RCA: 247

Total Agatston Score: 7747

[HOSPITAL] percentile: 82%

AORTA MEASUREMENTS:

Ascending Aorta: 38 mm

Descending Aorta: 29 mm

OTHER FINDINGS:

Normal heart size. No pericardial effusion. Ascending thoracic aorta
is upper limits of normal in size, measuring 3.8 cm. Moderate
calcified plaque of the thoracic aorta. Small hiatal hernia. No
enlarged lymph nodes seen in the chest. Central airways are patent.
No consolidation, pleural effusion or pneumothorax. Partially
visualized upper abdomen is unremarkable. No aggressive appearing
osseous lesions.
IMPRESSION: 1. Total Agatston Score: 7747
2. [HOSPITAL] percentile: 82%
3. Aortic Atherosclerosis ([id number]).

## 2023-05-11 ENCOUNTER — Ambulatory Visit: Payer: Self-pay | Admitting: Cardiology

## 2023-11-22 ENCOUNTER — Other Ambulatory Visit: Payer: Self-pay | Admitting: Cardiology

## 2023-12-31 ENCOUNTER — Other Ambulatory Visit: Payer: Self-pay | Admitting: Cardiology

## 2023-12-31 DIAGNOSIS — I10 Essential (primary) hypertension: Secondary | ICD-10-CM

## 2024-01-01 ENCOUNTER — Other Ambulatory Visit: Payer: Self-pay | Admitting: Cardiology

## 2024-01-01 DIAGNOSIS — E782 Mixed hyperlipidemia: Secondary | ICD-10-CM

## 2024-01-03 ENCOUNTER — Other Ambulatory Visit: Payer: Self-pay | Admitting: Cardiology

## 2024-01-03 DIAGNOSIS — I10 Essential (primary) hypertension: Secondary | ICD-10-CM

## 2024-03-29 ENCOUNTER — Other Ambulatory Visit: Payer: Self-pay | Admitting: Cardiology

## 2024-03-29 DIAGNOSIS — I10 Essential (primary) hypertension: Secondary | ICD-10-CM

## 2024-03-29 MED ORDER — VALSARTAN-HYDROCHLOROTHIAZIDE 160-12.5 MG PO TABS: 1.0000 | ORAL_TABLET | Freq: Every day | ORAL | 0 refills | Status: DC

## 2024-06-04 ENCOUNTER — Other Ambulatory Visit: Payer: Self-pay | Admitting: Cardiology

## 2024-06-04 DIAGNOSIS — I10 Essential (primary) hypertension: Secondary | ICD-10-CM

## 2024-07-08 ENCOUNTER — Other Ambulatory Visit: Payer: Self-pay | Admitting: Cardiology

## 2024-07-08 DIAGNOSIS — I10 Essential (primary) hypertension: Secondary | ICD-10-CM

## 2024-08-02 ENCOUNTER — Ambulatory Visit: Admitting: Emergency Medicine

## 2024-08-05 ENCOUNTER — Other Ambulatory Visit: Payer: Self-pay | Admitting: Cardiology

## 2024-08-05 DIAGNOSIS — I10 Essential (primary) hypertension: Secondary | ICD-10-CM

## 2024-08-23 ENCOUNTER — Ambulatory Visit: Admitting: Emergency Medicine

## 2024-09-29 ENCOUNTER — Ambulatory Visit: Admitting: Cardiology
